# Patient Record
Sex: Female | Born: 1960 | Race: White | Hispanic: No | Marital: Married | Smoking: Never smoker
Health system: Southern US, Community
[De-identification: ages and names within clinical notes are randomized; demographics above are authoritative.]

## PROBLEM LIST (undated history)

## (undated) DIAGNOSIS — M810 Age-related osteoporosis without current pathological fracture: Secondary | ICD-10-CM

## (undated) DIAGNOSIS — Z9889 Other specified postprocedural states: Secondary | ICD-10-CM

## (undated) DIAGNOSIS — R112 Nausea with vomiting, unspecified: Secondary | ICD-10-CM

## (undated) HISTORY — DX: Age-related osteoporosis without current pathological fracture: M81.0

---

## 1997-10-22 ENCOUNTER — Other Ambulatory Visit: Admission: RE | Admit: 1997-10-22 | Discharge: 1997-10-22 | Payer: Self-pay | Admitting: *Deleted

## 2000-01-22 ENCOUNTER — Other Ambulatory Visit: Admission: RE | Admit: 2000-01-22 | Discharge: 2000-01-22 | Payer: Self-pay | Admitting: *Deleted

## 2000-01-22 ENCOUNTER — Encounter (INDEPENDENT_AMBULATORY_CARE_PROVIDER_SITE_OTHER): Payer: Self-pay | Admitting: Specialist

## 2002-03-23 ENCOUNTER — Other Ambulatory Visit: Admission: RE | Admit: 2002-03-23 | Discharge: 2002-03-23 | Payer: Self-pay | Admitting: *Deleted

## 2003-04-20 ENCOUNTER — Other Ambulatory Visit: Admission: RE | Admit: 2003-04-20 | Discharge: 2003-04-20 | Payer: Self-pay | Admitting: Obstetrics and Gynecology

## 2004-05-08 ENCOUNTER — Other Ambulatory Visit: Admission: RE | Admit: 2004-05-08 | Discharge: 2004-05-08 | Payer: Self-pay | Admitting: Obstetrics and Gynecology

## 2008-11-16 ENCOUNTER — Ambulatory Visit: Payer: Self-pay | Admitting: Sports Medicine

## 2008-11-16 DIAGNOSIS — M7661 Achilles tendinitis, right leg: Secondary | ICD-10-CM | POA: Insufficient documentation

## 2008-11-16 DIAGNOSIS — R269 Unspecified abnormalities of gait and mobility: Secondary | ICD-10-CM | POA: Insufficient documentation

## 2008-11-16 DIAGNOSIS — M2170 Unequal limb length (acquired), unspecified site: Secondary | ICD-10-CM | POA: Insufficient documentation

## 2010-04-23 ENCOUNTER — Encounter: Payer: Self-pay | Admitting: Otolaryngology

## 2011-11-12 ENCOUNTER — Encounter: Payer: Self-pay | Admitting: Gastroenterology

## 2011-12-18 ENCOUNTER — Ambulatory Visit (AMBULATORY_SURGERY_CENTER): Payer: Managed Care, Other (non HMO) | Admitting: *Deleted

## 2011-12-18 VITALS — Ht 70.0 in | Wt 128.3 lb

## 2011-12-18 DIAGNOSIS — Z1211 Encounter for screening for malignant neoplasm of colon: Secondary | ICD-10-CM

## 2011-12-18 MED ORDER — MOVIPREP 100 G PO SOLR: 1.0000 | Freq: Once | ORAL | Status: DC

## 2011-12-19 ENCOUNTER — Encounter: Payer: Self-pay | Admitting: Gastroenterology

## 2011-12-25 NOTE — Addendum Note (Signed)
Addended by: Maple Hudson on: 12/25/2011 03:18 PM   Modules accepted: Level of Service

## 2012-01-01 ENCOUNTER — Ambulatory Visit (AMBULATORY_SURGERY_CENTER): Payer: Managed Care, Other (non HMO) | Admitting: Gastroenterology

## 2012-01-01 ENCOUNTER — Encounter: Payer: Self-pay | Admitting: Gastroenterology

## 2012-01-01 VITALS — BP 146/79 | HR 67 | Temp 97.2°F | Resp 17 | Ht 70.0 in | Wt 128.0 lb

## 2012-01-01 DIAGNOSIS — Z1211 Encounter for screening for malignant neoplasm of colon: Secondary | ICD-10-CM

## 2012-01-01 MED ORDER — SODIUM CHLORIDE 0.9 % IV SOLN: 500.0000 mL | INTRAVENOUS | Status: DC

## 2012-01-01 NOTE — Op Note (Signed)
[facility] [address] [place], [address]   COLONOSCOPY PROCEDURE REPORT  PATIENT: Barbara, Weaver  MR#: [id number] BIRTHDATE: 03/21/1961 , 51  yrs. old GENDER: Female ENDOSCOPIST: Rachael Fee, MD REFERRED XB:[id number] Avva, M.D. PROCEDURE DATE:  01/01/2012 PROCEDURE:   Colonoscopy, diagnostic ASA CLASS:   Class II INDICATIONS:average risk screening. MEDICATIONS: Fentanyl 100 mcg IV, Versed 10 mg IV, and These medications were titrated to patient response per physician's verbal order  DESCRIPTION OF PROCEDURE:   After the risks benefits and alternatives of the procedure were thoroughly explained, informed consent was obtained.  A digital rectal exam revealed no abnormalities of the rectum.   The LB [id number]-[id number]  endoscope was introduced through the anus and advanced to the cecum, which was identified by both the appendix and ileocecal valve. No adverse events experienced.   The quality of the prep was good.  The instrument was then slowly withdrawn as the colon was fully examined.    COLON FINDINGS: A normal appearing cecum, ileocecal valve, and appendiceal orifice were identified.  The ascending, hepatic flexure, transverse, splenic flexure, descending, sigmoid colon and rectum appeared unremarkable.  No polyps or cancers were seen. Retroflexed views revealed no abnormalities. The time to cecum=4 minutes 14 seconds.  Withdrawal time=7 minutes 06 seconds.  The scope was withdrawn and the procedure completed. COMPLICATIONS: There were no complications.  ENDOSCOPIC IMPRESSION: Normal colon; No plyps or cancers  RECOMMENDATIONS: You should continue to follow colorectal cancer screening guidelines for "routine risk" patients with a repeat colonoscopy in 10 years. There is no need for FOBT (stool) testing for at least 5 years.   eSigned:  Rachael Fee, MD 01/01/2012 10:17 AM

## 2012-01-01 NOTE — Progress Notes (Signed)
Patient did not have preoperative order for IV antibiotic SSI prophylaxis. (G8918)  Patient did not experience any of the following events: a burn prior to discharge; a fall within the facility; wrong site/side/patient/procedure/implant event; or a hospital transfer or hospital admission upon discharge from the facility. (G8907)  

## 2012-01-01 NOTE — Patient Instructions (Signed)
YOU HAD AN ENDOSCOPIC PROCEDURE TODAY AT THE [facility]: Refer to the procedure report that was given to you for any specific questions about what was found during the examination.  If the procedure report does not answer your questions, please call your gastroenterologist to clarify.  If you requested that your care partner not be given the details of your procedure findings, then the procedure report has been included in a sealed envelope for you to review at your convenience later.  YOU SHOULD EXPECT: Some feelings of bloating in the abdomen. Passage of more gas than usual.  Walking can help get rid of the air that was put into your GI tract during the procedure and reduce the bloating. If you had a lower endoscopy (such as a colonoscopy or flexible sigmoidoscopy) you may notice spotting of blood in your stool or on the toilet paper. If you underwent a bowel prep for your procedure, then you may not have a normal bowel movement for a few days.  DIET: Your first meal following the procedure should be a light meal and then it is ok to progress to your normal diet.  A half-sandwich or bowl of soup is an example of a good first meal.  Heavy or fried foods are harder to digest and may make you feel nauseous or bloated.  Likewise meals heavy in dairy and vegetables can cause extra gas to form and this can also increase the bloating.  Drink plenty of fluids but you should avoid alcoholic beverages for 24 hours.  ACTIVITY: Your care partner should take you home directly after the procedure.  You should plan to take it easy, moving slowly for the rest of the day.  You can resume normal activity the day after the procedure however you should NOT DRIVE or use heavy machinery for 24 hours (because of the sedation medicines used during the test).    SYMPTOMS TO REPORT IMMEDIATELY: A gastroenterologist can be reached at any hour.  During normal business hours, 8:30 AM to 5:00 PM Monday through Friday,  call [phone].  After hours and on weekends, please call the GI answering service at [phone] who will take a message and have the physician on call contact you.   Following lower endoscopy (colonoscopy or flexible sigmoidoscopy):  Excessive amounts of blood in the stool  Significant tenderness or worsening of abdominal pains  Swelling of the abdomen that is new, acute  Fever of 100F or higher    FOLLOW UP: If any biopsies were taken you will be contacted by phone or by letter within the next 1-3 weeks.  Call your gastroenterologist if you have not heard about the biopsies in 3 weeks.  Our staff will call the home number listed on your records the next business day following your procedure to check on you and address any questions or concerns that you may have at that time regarding the information given to you following your procedure. This is a courtesy call and so if there is no answer at the home number and we have not heard from you through the emergency physician on call, we will assume that you have returned to your regular daily activities without incident.  SIGNATURES/CONFIDENTIALITY: You and/or your care partner have signed paperwork which will be entered into your electronic medical record.  These signatures attest to the fact that that the information above on your After Visit Summary has been reviewed and is understood.  Full responsibility of the confidentiality   of this discharge information lies with you and/or your care-partner.     

## 2012-01-02 ENCOUNTER — Telehealth: Payer: Self-pay | Admitting: *Deleted

## 2012-01-02 NOTE — Telephone Encounter (Signed)
No answer, phone number only identifier. Message left to call if questions or concerns.

## 2014-01-05 ENCOUNTER — Ambulatory Visit (INDEPENDENT_AMBULATORY_CARE_PROVIDER_SITE_OTHER): Payer: Managed Care, Other (non HMO) | Admitting: Sports Medicine

## 2014-01-05 ENCOUNTER — Encounter: Payer: Self-pay | Admitting: Sports Medicine

## 2014-01-05 VITALS — BP 107/70 | Ht 70.0 in | Wt 128.0 lb

## 2014-01-05 DIAGNOSIS — M7661 Achilles tendinitis, right leg: Secondary | ICD-10-CM

## 2014-01-05 MED ORDER — NITROGLYCERIN 0.2 MG/HR TD PT24: MEDICATED_PATCH | TRANSDERMAL | Status: DC

## 2014-01-05 NOTE — Progress Notes (Signed)
   Subjective:    Patient ID: Barbara Weaver, female    DOB: [birth year], 53 y.o.   MRN: [id number]  HPI [name] is a 53 year old female who presents with right Achilles pain. Onset of symptoms was approximately 2 months ago without any known acute injury. She is a fairly frequent runner who is training for a half marathon next month. She says that she runs approximately 20-30 miles per week but has recently been somewhat limited due to her right Achilles pain. She does not site any instigating event, and she has not recently changed shoes. It appears as though she is due for new shoes. Aggravating symptoms include running or walking without supportive shoes. She has tried icing, ibuprofen, and calf raises with mild relief. She denies any weakness, numbness, tingling, or swelling. Since her problem started she is noticed gradually increasing right anterior knee pain as well. She feels that her gait has been altered due to her Achilles pain.  Past medical history, social history, medications, and allergies were reviewed and are up to date in the chart.  Review of Systems 7 point review of systems was performed and was otherwise negative unless noted in the history of present illness.    Objective:   Physical Exam BP 107/70  Ht 5\' 10"  (1.778 m)  Wt 128 lb (58.06 kg)  BMI 18.37 kg/m2 GEN: The patient is well-developed well-nourished female and in no acute distress.  She is awake alert and oriented x3. SKIN: warm and well-perfused, no rash  EXTR: No lower extremity edema or calf tenderness Neuro: Strength 5/5 globally. Sensation intact throughout. DTRs 2/4 bilaterally. No focal deficits. Vasc: +2 bilateral distal pulses. No edema.  MSK: Examination of the right foot and calf reveals good strength with plantar flexion. There is an area of tenderness to palpation approximately 2 cm proximal to the right Achilles insertion. There is no overlying erythema or induration. Negative calcaneal squeeze  test. There is no localized swelling. Examination of the right knee reveals no effusion. She has no valgus or varus laxity. Negative Lachman's. Negative McMurray's. Positive patellar grind test. She has good quadriceps strength.  Limited musculoskeletal ultrasound: Long and short axis views were obtained of the bilateral Achilles tendons. Her Achilles tendon on the left measures 0.46 cm 2 cm proximal to the insertion. On the right her Achilles tendon measures 0.68 cm with some overlying fluid and echogenicity at her region approximately 4 cm proximal to the Achilles tendon insertion. There did not appear to be any large tears. Color Doppler demonstrates increased neovascularization in this area of the Achilles tendon.     Assessment & Plan:  Please see problem based assessment and plan in the problem list.

## 2014-01-05 NOTE — Assessment & Plan Note (Signed)
-  Start heel lifts with eccentric exercises 15 reps x twice daily -Continue ice, anti-inflammatories -Apply nitroglycerin, 1/4 patch once daily to the right achilles area of tenderness. Do this daily for the next 6-8 weeks. Sometimes up to 12 weeks is needed. -I believe that your knee pain is related to your altered gait due to achilles tendonitis -Follow-up if needed Nitroglycerin Protocol

## 2014-01-05 NOTE — Patient Instructions (Addendum)
-  Start heel lifts with eccentric exercises 15 reps x twice daily -Continue ice, anti-inflammatories -Apply nitroglycerin, 1/4 patch once daily to the right achilles area of tenderness. Do this daily for the next 6-8 weeks. Sometimes up to 12 weeks is needed. -I believe that your knee pain is related to your altered gait due to achilles tendonitis -Follow-up if needed Nitroglycerin Protocol   Apply 1/4 nitroglycerin patch to affected area daily.  Change position of patch within the affected area every 24 hours.  You may experience a headache during the first 1-2 weeks of using the patch, these should subside.  If you experience headaches after beginning nitroglycerin patch treatment, you may take your preferred over the counter pain reliever.  Another side effect of the nitroglycerin patch is skin irritation or rash related to patch adhesive.  Please notify our office if you develop more severe headaches or rash, and stop the patch.  Tendon healing with nitroglycerin patch may require 12 to 24 weeks depending on the extent of injury.  Men should not use if taking Viagra, Cialis, or Levitra.   Do not use if you have migraines or rosacea.

## 2016-05-28 ENCOUNTER — Other Ambulatory Visit: Payer: Self-pay | Admitting: Orthopedic Surgery

## 2016-07-26 ENCOUNTER — Encounter (HOSPITAL_BASED_OUTPATIENT_CLINIC_OR_DEPARTMENT_OTHER): Payer: Self-pay | Admitting: *Deleted

## 2016-07-31 ENCOUNTER — Ambulatory Visit (HOSPITAL_BASED_OUTPATIENT_CLINIC_OR_DEPARTMENT_OTHER)
Admission: RE | Admit: 2016-07-31 | Discharge: 2016-07-31 | Disposition: A | Discharge status: Home / Self Care | Payer: BLUE CROSS/BLUE SHIELD | Source: Ambulatory Visit | Attending: Orthopedic Surgery | Admitting: Orthopedic Surgery

## 2016-07-31 ENCOUNTER — Encounter (HOSPITAL_BASED_OUTPATIENT_CLINIC_OR_DEPARTMENT_OTHER)
Admission: RE | Disposition: A | Discharge status: Home / Self Care | Payer: Self-pay | Source: Ambulatory Visit | Attending: Orthopedic Surgery

## 2016-07-31 ENCOUNTER — Encounter (HOSPITAL_BASED_OUTPATIENT_CLINIC_OR_DEPARTMENT_OTHER): Payer: Self-pay

## 2016-07-31 ENCOUNTER — Ambulatory Visit (HOSPITAL_BASED_OUTPATIENT_CLINIC_OR_DEPARTMENT_OTHER): Payer: BLUE CROSS/BLUE SHIELD | Admitting: Anesthesiology

## 2016-07-31 DIAGNOSIS — Z882 Allergy status to sulfonamides status: Secondary | ICD-10-CM | POA: Insufficient documentation

## 2016-07-31 DIAGNOSIS — L608 Other nail disorders: Secondary | ICD-10-CM | POA: Diagnosis not present

## 2016-07-31 DIAGNOSIS — Z833 Family history of diabetes mellitus: Secondary | ICD-10-CM | POA: Diagnosis not present

## 2016-07-31 DIAGNOSIS — Z800 Family history of malignant neoplasm of digestive organs: Secondary | ICD-10-CM | POA: Diagnosis not present

## 2016-07-31 DIAGNOSIS — Z9889 Other specified postprocedural states: Secondary | ICD-10-CM | POA: Diagnosis not present

## 2016-07-31 DIAGNOSIS — D1801 Hemangioma of skin and subcutaneous tissue: Secondary | ICD-10-CM | POA: Insufficient documentation

## 2016-07-31 DIAGNOSIS — L988 Other specified disorders of the skin and subcutaneous tissue: Secondary | ICD-10-CM | POA: Diagnosis present

## 2016-07-31 HISTORY — DX: Other specified postprocedural states: Z98.890

## 2016-07-31 HISTORY — DX: Nausea with vomiting, unspecified: R11.2

## 2016-07-31 HISTORY — PX: EXCISION METACARPAL MASS: SHX6372

## 2016-07-31 SURGERY — EXCISION METACARPAL MASS
Anesthesia: General | Site: Thumb | Laterality: Right

## 2016-07-31 MED ORDER — SCOPOLAMINE 1 MG/3DAYS TD PT72: 1.0000 | MEDICATED_PATCH | Freq: Once | TRANSDERMAL | Status: DC | PRN

## 2016-07-31 MED ORDER — DEXAMETHASONE SODIUM PHOSPHATE 10 MG/ML IJ SOLN
INTRAMUSCULAR | Status: AC
  Filled 2016-07-31: qty 1

## 2016-07-31 MED ORDER — LACTATED RINGERS IV SOLN
INTRAVENOUS | Status: DC
  Administered 2016-07-31: 08:00:00 via INTRAVENOUS

## 2016-07-31 MED ORDER — TRAMADOL HCL 50 MG PO TABS: 50.0000 mg | ORAL_TABLET | Freq: Four times a day (QID) | ORAL | 0 refills | Status: DC | PRN

## 2016-07-31 MED ORDER — MIDAZOLAM HCL 2 MG/2ML IJ SOLN
1.0000 mg | INTRAMUSCULAR | Status: DC | PRN
  Administered 2016-07-31: 2 mg via INTRAVENOUS

## 2016-07-31 MED ORDER — CEFAZOLIN SODIUM-DEXTROSE 2-4 GM/100ML-% IV SOLN
INTRAVENOUS | Status: AC
  Filled 2016-07-31: qty 100

## 2016-07-31 MED ORDER — DEXAMETHASONE SODIUM PHOSPHATE 10 MG/ML IJ SOLN
INTRAMUSCULAR | Status: DC | PRN
  Administered 2016-07-31: 10 mg via INTRAVENOUS

## 2016-07-31 MED ORDER — CEFAZOLIN SODIUM-DEXTROSE 2-4 GM/100ML-% IV SOLN
2.0000 g | INTRAVENOUS | Status: AC
  Administered 2016-07-31: 2 g via INTRAVENOUS

## 2016-07-31 MED ORDER — PROPOFOL 500 MG/50ML IV EMUL
INTRAVENOUS | Status: DC | PRN
  Administered 2016-07-31: 75 ug/kg/min via INTRAVENOUS

## 2016-07-31 MED ORDER — BUPIVACAINE HCL (PF) 0.25 % IJ SOLN
INTRAMUSCULAR | Status: DC | PRN
  Administered 2016-07-31: 8 mL

## 2016-07-31 MED ORDER — ONDANSETRON HCL 4 MG/2ML IJ SOLN
INTRAMUSCULAR | Status: DC | PRN
  Administered 2016-07-31: 4 mg via INTRAVENOUS

## 2016-07-31 MED ORDER — MIDAZOLAM HCL 2 MG/2ML IJ SOLN
INTRAMUSCULAR | Status: AC
  Filled 2016-07-31: qty 2

## 2016-07-31 MED ORDER — BUPIVACAINE HCL (PF) 0.25 % IJ SOLN
INTRAMUSCULAR | Status: AC
  Filled 2016-07-31: qty 30

## 2016-07-31 MED ORDER — FENTANYL CITRATE (PF) 100 MCG/2ML IJ SOLN
50.0000 ug | INTRAMUSCULAR | Status: DC | PRN
  Administered 2016-07-31: 100 ug via INTRAVENOUS

## 2016-07-31 MED ORDER — ONDANSETRON HCL 4 MG/2ML IJ SOLN
INTRAMUSCULAR | Status: AC
  Filled 2016-07-31: qty 2

## 2016-07-31 MED ORDER — PROPOFOL 500 MG/50ML IV EMUL
INTRAVENOUS | Status: AC
  Filled 2016-07-31: qty 50

## 2016-07-31 MED ORDER — FENTANYL CITRATE (PF) 100 MCG/2ML IJ SOLN
INTRAMUSCULAR | Status: AC
  Filled 2016-07-31: qty 2

## 2016-07-31 MED ORDER — PROMETHAZINE HCL 25 MG/ML IJ SOLN: 6.2500 mg | INTRAMUSCULAR | Status: DC | PRN

## 2016-07-31 MED ORDER — CHLORHEXIDINE GLUCONATE 4 % EX LIQD: 60.0000 mL | Freq: Once | CUTANEOUS | Status: DC

## 2016-07-31 MED ORDER — HYDROMORPHONE HCL 1 MG/ML IJ SOLN: 0.2500 mg | INTRAMUSCULAR | Status: DC | PRN

## 2016-07-31 MED ORDER — LIDOCAINE 2% (20 MG/ML) 5 ML SYRINGE
INTRAMUSCULAR | Status: DC | PRN
  Administered 2016-07-31: 40 mg via INTRAVENOUS

## 2016-07-31 SURGICAL SUPPLY — 52 items
BANDAGE COBAN STERILE 2 (GAUZE/BANDAGES/DRESSINGS) IMPLANT
BLADE MINI RND TIP GREEN BEAV (BLADE) ×1 IMPLANT
BLADE SURG 15 STRL LF DISP TIS (BLADE) ×1 IMPLANT
BLADE SURG 15 STRL SS (BLADE) ×2
BNDG CMPR 9X4 STRL LF SNTH (GAUZE/BANDAGES/DRESSINGS)
BNDG COHESIVE 1X5 TAN STRL LF (GAUZE/BANDAGES/DRESSINGS) ×1 IMPLANT
BNDG COHESIVE 3X5 TAN STRL LF (GAUZE/BANDAGES/DRESSINGS) IMPLANT
BNDG ESMARK 4X9 LF (GAUZE/BANDAGES/DRESSINGS) IMPLANT
BNDG GAUZE ELAST 4 BULKY (GAUZE/BANDAGES/DRESSINGS) IMPLANT
CHLORAPREP W/TINT 26ML (MISCELLANEOUS) ×2 IMPLANT
CORDS BIPOLAR (ELECTRODE) ×2 IMPLANT
COVER BACK TABLE 60X90IN (DRAPES) ×2 IMPLANT
COVER MAYO STAND STRL (DRAPES) ×2 IMPLANT
CUFF TOURNIQUET SINGLE 18IN (TOURNIQUET CUFF) ×1 IMPLANT
DECANTER SPIKE VIAL GLASS SM (MISCELLANEOUS) IMPLANT
DRAIN PENROSE 1/2X12 LTX STRL (WOUND CARE) IMPLANT
DRAPE EXTREMITY T 121X128X90 (DRAPE) ×2 IMPLANT
DRAPE SURG 17X23 STRL (DRAPES) ×2 IMPLANT
GAUZE SPONGE 4X4 12PLY STRL (GAUZE/BANDAGES/DRESSINGS) ×2 IMPLANT
GAUZE XEROFORM 1X8 LF (GAUZE/BANDAGES/DRESSINGS) ×2 IMPLANT
GLOVE BIO SURGEON STRL SZ 6.5 (GLOVE) ×3 IMPLANT
GLOVE BIOGEL PI IND STRL 7.0 (GLOVE) IMPLANT
GLOVE BIOGEL PI IND STRL 8.5 (GLOVE) ×1 IMPLANT
GLOVE BIOGEL PI INDICATOR 7.0 (GLOVE) ×3
GLOVE BIOGEL PI INDICATOR 8.5 (GLOVE) ×1
GLOVE SURG ORTHO 8.0 STRL STRW (GLOVE) ×2 IMPLANT
GOWN STRL REUS W/ TWL LRG LVL3 (GOWN DISPOSABLE) ×1 IMPLANT
GOWN STRL REUS W/TWL LRG LVL3 (GOWN DISPOSABLE) ×4
GOWN STRL REUS W/TWL XL LVL3 (GOWN DISPOSABLE) ×2 IMPLANT
NDL PRECISIONGLIDE 27X1.5 (NEEDLE) IMPLANT
NDL SAFETY ECLIPSE 18X1.5 (NEEDLE) ×1 IMPLANT
NEEDLE HYPO 18GX1.5 SHARP (NEEDLE) ×2
NEEDLE PRECISIONGLIDE 27X1.5 (NEEDLE) ×2 IMPLANT
NS IRRIG 1000ML POUR BTL (IV SOLUTION) ×2 IMPLANT
PACK BASIN DAY SURGERY FS (CUSTOM PROCEDURE TRAY) ×2 IMPLANT
PAD CAST 3X4 CTTN HI CHSV (CAST SUPPLIES) IMPLANT
PADDING CAST ABS 3INX4YD NS (CAST SUPPLIES)
PADDING CAST ABS 4INX4YD NS (CAST SUPPLIES) ×1
PADDING CAST ABS COTTON 3X4 (CAST SUPPLIES) IMPLANT
PADDING CAST ABS COTTON 4X4 ST (CAST SUPPLIES) ×1 IMPLANT
PADDING CAST COTTON 3X4 STRL (CAST SUPPLIES)
SPLINT FINGER 3.25 BULB 911905 (SOFTGOODS) ×1 IMPLANT
SPLINT PLASTER CAST XFAST 3X15 (CAST SUPPLIES) IMPLANT
SPLINT PLASTER XTRA FASTSET 3X (CAST SUPPLIES)
STOCKINETTE 4X48 STRL (DRAPES) ×2 IMPLANT
SUT CHROMIC 6 0 G 1 (SUTURE) ×1 IMPLANT
SUT ETHILON 4 0 PS 2 18 (SUTURE) ×2 IMPLANT
SUT VIC AB 4-0 P2 18 (SUTURE) IMPLANT
SYR BULB 3OZ (MISCELLANEOUS) ×2 IMPLANT
SYR CONTROL 10ML LL (SYRINGE) ×1 IMPLANT
TOWEL OR 17X24 6PK STRL BLUE (TOWEL DISPOSABLE) ×4 IMPLANT
UNDERPAD 30X30 (UNDERPADS AND DIAPERS) ×2 IMPLANT

## 2016-07-31 NOTE — Op Note (Signed)
Dictation Number [id number]

## 2016-07-31 NOTE — Anesthesia Preprocedure Evaluation (Addendum)
Anesthesia Evaluation  Patient identified by MRN, date of birth, ID band Patient awake    Reviewed: Allergy & Precautions, NPO status , Patient's Chart, lab work & pertinent test results  History of Anesthesia Complications (+) history of anesthetic complications  Airway Mallampati: II   Neck ROM: Full    Dental no notable dental hx.    Pulmonary neg pulmonary ROS,    breath sounds clear to auscultation       Cardiovascular negative cardio ROS   Rhythm:Regular Rate:Normal     Neuro/Psych negative neurological ROS  negative psych ROS   GI/Hepatic negative GI ROS, Neg liver ROS,   Endo/Other  negative endocrine ROS  Renal/GU negative Renal ROS  negative genitourinary   Musculoskeletal negative musculoskeletal ROS (+)   Abdominal   Peds negative pediatric ROS (+)  Hematology negative hematology ROS (+)   Anesthesia Other Findings   Reproductive/Obstetrics negative OB ROS                            Anesthesia Physical Anesthesia Plan  ASA: I  Anesthesia Plan: Regional   Post-op Pain Management:    Induction:   Airway Management Planned: Natural Airway  Additional Equipment:   Intra-op Plan:   Post-operative Plan:   Informed Consent: I have reviewed the patients History and Physical, chart, labs and discussed the procedure including the risks, benefits and alternatives for the proposed anesthesia with the patient or authorized representative who has indicated his/her understanding and acceptance.   Dental advisory given  Plan Discussed with:   Anesthesia Plan Comments:        Anesthesia Quick Evaluation

## 2016-07-31 NOTE — H&P (Signed)
  Barbara Weaver is an 56 y.o. female.   Chief Complaint: mass right thumb and melanotic striata nailbed HPI: Barbara Weaver is a 56 year old right-hand-dominant female referred by Dr. Renda Rolls. She is referred with a melanotic streak on her right thumb nail matrix. She also has a mass of the metacarpal phalangeal joint. This she states this is been present for approximately a year and a half. She recalls no history of injury. She states it is not enlarged. It causes mild discomfort of the nail is present time. This gives her a aching type pain approximately 4/10. Is dull in nature. She is not complaining of any discomfort over the mass on the dorsal aspect of the metacarpal phalangeal joint. She has no history diabetes thyroid problems arthritis gout. Family history is positive diabetes negative for thyroid problems arthritis and gout. She has been tested for diabetes. She states it is not enlarged or significantly changed in position.      Past Medical History:  Diagnosis Date  . PONV (postoperative nausea and vomiting)     Past Surgical History:  Procedure Laterality Date  . CESAREAN SECTION  1992    Family History  Problem Relation Age of Onset  . Esophageal cancer Mother   . Colon cancer Neg Hx   . Rectal cancer Neg Hx   . Stomach cancer Neg Hx    Social History:  reports that she has never smoked. She has never used smokeless tobacco. She reports that she drinks about 2.5 oz of alcohol per week . She reports that she does not use drugs.  Allergies:  Allergies  Allergen Reactions  . Sulfa Antibiotics Rash    No prescriptions prior to admission.    No results found for this or any previous visit (from the past 48 hour(s)).  No results found.   Pertinent items are noted in HPI.  Height 5\' 10"  (1.778 m), weight 58.1 kg (128 lb), last menstrual period 12/17/2011.  General appearance: alert, cooperative and appears stated age Head: Normocephalic, without obvious  abnormality Neck: no JVD Resp: clear to auscultation bilaterally Cardio: regular rate and rhythm, S1, S2 normal, no murmur, click, rub or gallop GI: soft, non-tender; bowel sounds normal; no masses,  no organomegaly Extremities: mass right thumb pigmented lesion nailbed Pulses: 2+ and symmetric Skin: Skin color, texture, turgor normal. No rashes or lesions Neurologic: Grossly normal Incision/Wound: na  Assessment/Plan Assessment:   Mass   Melanonychia striata    Plan: She is aware of the potential that this could be a melanoma. Dr. Armanda Magic notes are reviewed. Discussed possibility of biopsy of the nail plate matrix with her. This will be done along with excision of the mass at the metacarpal phalangeal joint. Aware that there is no guarantee to the surgery the possibility of infection recurrence injury to arteries nerves tendons incomplete release symptoms dystrophy. Possibility of further intervention being necessary. She would like to proceed. She is scheduled for nail bed biopsy right thumb with excision of mass metacarpal phalangeal joint right thumb as an outpatient under regional anesthesia.      Amori Cooperman R 07/31/2016, 6:22 AM

## 2016-07-31 NOTE — Brief Op Note (Signed)
07/31/2016  9:06 AM  PATIENT:  Shanda Bumps  56 y.o. female  PRE-OPERATIVE DIAGNOSIS:  MELANOTIC STREAK RIGHT THUMB, MASS MCP  POST-OPERATIVE DIAGNOSIS:  MELANOTIC STREAK RIGHT THUMB, MASS MCP  PROCEDURE:  Procedure(s): EXCISION MASS RIGHT THUMB (Right) BIOPSY RIGHT NAIL MATRIX (Right)  SURGEON:  Surgeon(s) and Role:    * Daryll Brod, MD - Primary  PHYSICIAN ASSISTANT:   ASSISTANTS: none   ANESTHESIA:   local and regional  EBL:  Total I/O In: 400 [I.V.:400] Out: 2 [Blood:2]  BLOOD ADMINISTERED:none  DRAINS: none   LOCAL MEDICATIONS USED:  BUPIVICAINE   SPECIMEN:  Excision  DISPOSITION OF SPECIMEN:  PATHOLOGY  COUNTS:  YES  TOURNIQUET:   Total Tourniquet Time Documented: Upper Arm (Right) - 25 minutes Total: Upper Arm (Right) - 25 minutes   DICTATION: .Other Dictation: Dictation Number [id number]  PLAN OF CARE: Discharge to home after PACU  PATIENT DISPOSITION:  PACU - hemodynamically stable.

## 2016-07-31 NOTE — Op Note (Signed)
NAME:  Barbara Weaver               ACCOUNT NO.:  [id number]  MEDICAL RECORD NO.:  [id number]  LOCATION:                                 FACILITY:  PHYSICIAN:  Daryll Brod, M.D.            DATE OF BIRTH:  DATE OF PROCEDURE:  07/31/2016 DATE OF DISCHARGE:                              OPERATIVE REPORT   PREOPERATIVE DIAGNOSIS:  Mass, right thumb; melanotic striata, right thumb nail bed.  POSTOPERATIVE DIAGNOSIS:  Mass, right thumb; melanotic striata, right thumb nail bed.  OPERATION:  Excision of mass, right thumb metacarpophalangeal joint. Biopsy nail bed, right thumb.  SURGEON:  Daryll Brod, M.D.  ASSISTANT:  None.  ANESTHESIA:  Forearm-based IV regional with metacarpal block.  PLACE OF SURGERY:  [facility].  HISTORY:  The patient is a 56 year old female with a melanotic streak going down her right thumb nail.  She also has a mass over the metacarpophalangeal joint of the right thumb.  She is desirous of having the mass removed along with biopsy of the nail matrix.  Pre, peri, and postoperative course have been discussed along with risks and complications.  She is aware that there is no guarantee to the surgery; the possibility of infection; recurrence of injury to arteries, nerves, tendons; incomplete relief of symptoms; and dystrophy.  In preoperative area, the patient was seen, the extremity was marked by both patient and surgeon.  Antibiotic given.  PROCEDURE IN DETAIL:  The patient was brought to the operating room, where a forearm-based IV regional anesthetic was carried out without difficulty under the direction of the Anesthesia Department.  She was prepped using ChloraPrep in a supine position with the right arm free. A 3-minute dry time was allowed, and a time-out taken, confirming the patient and procedure.  The mass over the metacarpophalangeal joint was approached first.  A metacarpal block was given 0.25% bupivacaine without epinephrine.  The 8 mL  was used.  The incision was made over the mass, carried down through subcutaneous tissue.  A multilobulated dark mass was immediately encountered directly adjacent to a dorsal vein. With blunt and sharp dissection, it was dissected free.  The vein cauterized and that it appeared that this may be a vascular malformation coming from the vein.  The specimen was sent to Pathology.  This measured approximately 6/10s of a centimeter in diameter.  The entire specimen was sent.  The wound was irrigated and the skin closed with interrupted 4-0 nylon sutures.  The nail plate was then removed with a periosteal elevator.  A large ridge was present directly under the nail plate where the melanotic strip was noted.  This had been marked prior to removal of the nail on either end.  An elliptical incision was then made over the entire area of the change.  The incision was carried out with separate instruments and carried down to the bone.  The specimen was sent to Pathology.  The nail matrix was then undermined on either side, irrigated and closed with interrupted 6-0 chromic sutures.  A nonadherent gauze was placed into the nail fold.  Nonadherent gauze was placed over this and over  the mass proximally.  A sterile compressive dressing and splint to the finger applied.  On deflation of the tourniquet, remaining fingers pinked.  She was taken to the recovery room for observation in satisfactory condition.  She will be discharged to home to return to the [place] in 1 week, on [facility].          ______________________________ Daryll Brod, M.D.     GK/MEDQ  D:  07/31/2016  T:  07/31/2016  Job:  [id number]

## 2016-07-31 NOTE — Anesthesia Procedure Notes (Signed)
Anesthesia Regional Block: Bier block (IV Regional)   Pre-Anesthetic Checklist: ,, timeout performed, Correct Patient, Correct Site, Correct Laterality, Correct Procedure,, site marked, surgical consent,, at surgeon's request Needles:  Injection technique: Single-shot  Needle Type: Other      Needle Gauge: 22     Additional Needles:   Procedures:,,,,,,, Esmarch exsanguination, single tourniquet utilized,  Narrative:  Start time: 07/31/2016 8:34 AM End time: 07/31/2016 8:35 AM Injection made incrementally with aspirations every 35 mL.  Performed by: Personally   Additional Notes: Esmark wrap, torq inflated to 250, neg pulse, inject slowly 0.5% pres free Lidocaine. Pt tol well, VSS.

## 2016-07-31 NOTE — Discharge Instructions (Addendum)

## 2016-07-31 NOTE — Anesthesia Postprocedure Evaluation (Addendum)
Anesthesia Post Note  Patient: ANGELAMARIE AVAKIAN  Procedure(s) Performed: Procedure(s) (LRB): EXCISION MASS RIGHT THUMB (Right) BIOPSY RIGHT NAIL MATRIX (Right)  Patient location during evaluation: PACU Anesthesia Type: MAC Level of consciousness: awake and alert Pain management: pain level controlled Vital Signs Assessment: post-procedure vital signs reviewed and stable Respiratory status: spontaneous breathing, nonlabored ventilation, respiratory function stable and patient connected to nasal cannula oxygen Cardiovascular status: stable and blood pressure returned to baseline Anesthetic complications: no       Last Vitals:  Vitals:   07/31/16 0915 07/31/16 0935  BP: 110/84 132/83  Pulse: (!) 59 (!) 56  Resp: 17 16  Temp:  36.6 C    Last Pain:  Vitals:   07/31/16 0935  TempSrc: Oral  PainSc:                  Manha Amato,JAMES TERRILL

## 2016-07-31 NOTE — Transfer of Care (Signed)
Immediate Anesthesia Transfer of Care Note  Patient: Barbara Weaver  Procedure(s) Performed: Procedure(s): EXCISION MASS RIGHT THUMB (Right) BIOPSY RIGHT NAIL MATRIX (Right)  Patient Location: PACU  Anesthesia Type:MAC  Level of Consciousness: awake and alert   Airway & Oxygen Therapy: Patient Spontanous Breathing and Patient connected to face mask oxygen  Post-op Assessment: Report given to RN and Post -op Vital signs reviewed and stable  Post vital signs: Reviewed and stable  Last Vitals:  Vitals:   07/31/16 0742  BP: 118/66  Pulse: (!) 57  Resp: 18  Temp: 36.4 C    Last Pain:  Vitals:   07/31/16 0742  TempSrc: Oral         Complications: No apparent anesthesia complications

## 2016-08-01 ENCOUNTER — Encounter (HOSPITAL_BASED_OUTPATIENT_CLINIC_OR_DEPARTMENT_OTHER): Payer: Self-pay | Admitting: Orthopedic Surgery

## 2016-08-31 NOTE — Addendum Note (Signed)
Addendum  created 08/31/16 1429 by Rica Koyanagi, MD   Sign clinical note

## 2018-01-21 DIAGNOSIS — Z1231 Encounter for screening mammogram for malignant neoplasm of breast: Secondary | ICD-10-CM | POA: Diagnosis not present

## 2018-03-07 DIAGNOSIS — Z0000 Encounter for general adult medical examination without abnormal findings: Secondary | ICD-10-CM | POA: Diagnosis not present

## 2018-03-07 DIAGNOSIS — M859 Disorder of bone density and structure, unspecified: Secondary | ICD-10-CM | POA: Diagnosis not present

## 2018-03-14 DIAGNOSIS — Z1389 Encounter for screening for other disorder: Secondary | ICD-10-CM | POA: Diagnosis not present

## 2018-03-14 DIAGNOSIS — Z6827 Body mass index (BMI) 27.0-27.9, adult: Secondary | ICD-10-CM | POA: Diagnosis not present

## 2018-03-14 DIAGNOSIS — M859 Disorder of bone density and structure, unspecified: Secondary | ICD-10-CM | POA: Diagnosis not present

## 2018-03-14 DIAGNOSIS — J3089 Other allergic rhinitis: Secondary | ICD-10-CM | POA: Diagnosis not present

## 2018-03-14 DIAGNOSIS — Z23 Encounter for immunization: Secondary | ICD-10-CM | POA: Diagnosis not present

## 2018-03-14 DIAGNOSIS — Z0000 Encounter for general adult medical examination without abnormal findings: Secondary | ICD-10-CM | POA: Diagnosis not present

## 2018-05-15 DIAGNOSIS — H5213 Myopia, bilateral: Secondary | ICD-10-CM | POA: Diagnosis not present

## 2018-08-06 DIAGNOSIS — H43811 Vitreous degeneration, right eye: Secondary | ICD-10-CM | POA: Diagnosis not present

## 2018-08-06 DIAGNOSIS — H5213 Myopia, bilateral: Secondary | ICD-10-CM | POA: Diagnosis not present

## 2019-01-03 DIAGNOSIS — Z23 Encounter for immunization: Secondary | ICD-10-CM | POA: Diagnosis not present

## 2019-01-26 DIAGNOSIS — Z681 Body mass index (BMI) 19 or less, adult: Secondary | ICD-10-CM | POA: Diagnosis not present

## 2019-01-26 DIAGNOSIS — Z01419 Encounter for gynecological examination (general) (routine) without abnormal findings: Secondary | ICD-10-CM | POA: Diagnosis not present

## 2019-01-26 DIAGNOSIS — Z1231 Encounter for screening mammogram for malignant neoplasm of breast: Secondary | ICD-10-CM | POA: Diagnosis not present

## 2019-03-31 DIAGNOSIS — M859 Disorder of bone density and structure, unspecified: Secondary | ICD-10-CM | POA: Diagnosis not present

## 2019-03-31 DIAGNOSIS — Z0000 Encounter for general adult medical examination without abnormal findings: Secondary | ICD-10-CM | POA: Diagnosis not present

## 2019-03-31 DIAGNOSIS — R7889 Finding of other specified substances, not normally found in blood: Secondary | ICD-10-CM | POA: Diagnosis not present

## 2019-04-07 DIAGNOSIS — Z1331 Encounter for screening for depression: Secondary | ICD-10-CM | POA: Diagnosis not present

## 2019-04-07 DIAGNOSIS — M8580 Other specified disorders of bone density and structure, unspecified site: Secondary | ICD-10-CM | POA: Diagnosis not present

## 2019-04-07 DIAGNOSIS — Z0000 Encounter for general adult medical examination without abnormal findings: Secondary | ICD-10-CM | POA: Diagnosis not present

## 2019-05-18 DIAGNOSIS — H5213 Myopia, bilateral: Secondary | ICD-10-CM | POA: Diagnosis not present

## 2019-05-30 ENCOUNTER — Ambulatory Visit: Payer: Self-pay | Attending: Internal Medicine

## 2019-05-30 DIAGNOSIS — Z23 Encounter for immunization: Secondary | ICD-10-CM | POA: Insufficient documentation

## 2019-05-30 NOTE — Progress Notes (Signed)
   Covid-19 Vaccination Clinic  Name:  Barbara Weaver    MRN: [id number] DOB: 1960-08-20  05/30/2019  [name] was observed post Covid-19 immunization for 15 minutes without incidence. She was provided with Vaccine Information Sheet and instruction to access the V-Safe system.   [name] was instructed to call 911 with any severe reactions post vaccine: Marland Kitchen Difficulty breathing  . Swelling of your face and throat  . A fast heartbeat  . A bad rash all over your body  . Dizziness and weakness    Immunizations Administered    Name Date Dose VIS Date Route   Pfizer COVID-19 Vaccine 05/30/2019  3:14 PM 0.3 mL 03/13/2019 Intramuscular   Manufacturer: [place]   Lot: [id number]   [place]: [id number]

## 2019-06-18 DIAGNOSIS — L821 Other seborrheic keratosis: Secondary | ICD-10-CM | POA: Diagnosis not present

## 2019-06-18 DIAGNOSIS — D485 Neoplasm of uncertain behavior of skin: Secondary | ICD-10-CM | POA: Diagnosis not present

## 2019-06-18 DIAGNOSIS — C44319 Basal cell carcinoma of skin of other parts of face: Secondary | ICD-10-CM | POA: Diagnosis not present

## 2019-06-18 DIAGNOSIS — Z23 Encounter for immunization: Secondary | ICD-10-CM | POA: Diagnosis not present

## 2019-06-18 DIAGNOSIS — D225 Melanocytic nevi of trunk: Secondary | ICD-10-CM | POA: Diagnosis not present

## 2019-06-18 DIAGNOSIS — L814 Other melanin hyperpigmentation: Secondary | ICD-10-CM | POA: Diagnosis not present

## 2019-06-24 ENCOUNTER — Ambulatory Visit: Payer: Self-pay | Attending: Internal Medicine

## 2019-06-24 DIAGNOSIS — Z23 Encounter for immunization: Secondary | ICD-10-CM

## 2019-06-24 NOTE — Progress Notes (Signed)
   Covid-19 Vaccination Clinic  Name:  Barbara Weaver    MRN: [id number] DOB: Oct 25, 1960  06/24/2019  [name] was observed post Covid-19 immunization for 15 minutes without incident. She was provided with Vaccine Information Sheet and instruction to access the V-Safe system.   [name] was instructed to call 911 with any severe reactions post vaccine: Marland Kitchen Difficulty breathing  . Swelling of face and throat  . A fast heartbeat  . A bad rash all over body  . Dizziness and weakness   Immunizations Administered    Name Date Dose VIS Date Route   Pfizer COVID-19 Vaccine 06/24/2019  2:30 PM 0.3 mL 03/13/2019 Intramuscular   Manufacturer: [place]   Lot: [id number]   [place]: [id number]

## 2019-07-28 DIAGNOSIS — C44319 Basal cell carcinoma of skin of other parts of face: Secondary | ICD-10-CM | POA: Diagnosis not present

## 2019-11-09 ENCOUNTER — Other Ambulatory Visit (HOSPITAL_COMMUNITY): Payer: Self-pay | Admitting: Internal Medicine

## 2019-11-09 MED FILL — SHINGRIX 50 MCG SUS: 50 | 1 days supply | Qty: 1 | Fill #0

## 2019-11-27 DIAGNOSIS — R6884 Jaw pain: Secondary | ICD-10-CM | POA: Diagnosis not present

## 2019-12-09 DIAGNOSIS — R6884 Jaw pain: Secondary | ICD-10-CM | POA: Diagnosis not present

## 2019-12-16 DIAGNOSIS — R6884 Jaw pain: Secondary | ICD-10-CM | POA: Diagnosis not present

## 2019-12-23 DIAGNOSIS — R6884 Jaw pain: Secondary | ICD-10-CM | POA: Diagnosis not present

## 2019-12-29 DIAGNOSIS — R6884 Jaw pain: Secondary | ICD-10-CM | POA: Diagnosis not present

## 2019-12-30 DIAGNOSIS — R229 Localized swelling, mass and lump, unspecified: Secondary | ICD-10-CM | POA: Diagnosis not present

## 2020-01-06 DIAGNOSIS — R6884 Jaw pain: Secondary | ICD-10-CM | POA: Diagnosis not present

## 2020-01-09 MED FILL — SHINGRIX 50 MCG SUS: 50 | 1 days supply | Qty: 1 | Fill #1

## 2020-01-15 DIAGNOSIS — R6884 Jaw pain: Secondary | ICD-10-CM | POA: Diagnosis not present

## 2020-01-22 DIAGNOSIS — R6884 Jaw pain: Secondary | ICD-10-CM | POA: Diagnosis not present

## 2020-02-05 DIAGNOSIS — R6884 Jaw pain: Secondary | ICD-10-CM | POA: Diagnosis not present

## 2020-02-09 DIAGNOSIS — Z681 Body mass index (BMI) 19 or less, adult: Secondary | ICD-10-CM | POA: Diagnosis not present

## 2020-02-09 DIAGNOSIS — Z13820 Encounter for screening for osteoporosis: Secondary | ICD-10-CM | POA: Diagnosis not present

## 2020-02-09 DIAGNOSIS — Z01419 Encounter for gynecological examination (general) (routine) without abnormal findings: Secondary | ICD-10-CM | POA: Diagnosis not present

## 2020-02-09 DIAGNOSIS — Z1231 Encounter for screening mammogram for malignant neoplasm of breast: Secondary | ICD-10-CM | POA: Diagnosis not present

## 2020-03-09 MED FILL — SHINGRIX 50 MCG SUS: 50 | 1 days supply | Qty: 1 | Fill #1

## 2020-04-20 DIAGNOSIS — R7989 Other specified abnormal findings of blood chemistry: Secondary | ICD-10-CM | POA: Diagnosis not present

## 2020-04-20 DIAGNOSIS — M859 Disorder of bone density and structure, unspecified: Secondary | ICD-10-CM | POA: Diagnosis not present

## 2020-04-20 DIAGNOSIS — Z0000 Encounter for general adult medical examination without abnormal findings: Secondary | ICD-10-CM | POA: Diagnosis not present

## 2020-04-27 DIAGNOSIS — Z23 Encounter for immunization: Secondary | ICD-10-CM | POA: Diagnosis not present

## 2020-04-27 DIAGNOSIS — R82998 Other abnormal findings in urine: Secondary | ICD-10-CM | POA: Diagnosis not present

## 2020-04-27 DIAGNOSIS — M8580 Other specified disorders of bone density and structure, unspecified site: Secondary | ICD-10-CM | POA: Diagnosis not present

## 2020-04-27 DIAGNOSIS — Z0000 Encounter for general adult medical examination without abnormal findings: Secondary | ICD-10-CM | POA: Diagnosis not present

## 2020-06-03 DIAGNOSIS — H524 Presbyopia: Secondary | ICD-10-CM | POA: Diagnosis not present

## 2020-06-03 DIAGNOSIS — H2513 Age-related nuclear cataract, bilateral: Secondary | ICD-10-CM | POA: Diagnosis not present

## 2020-06-03 DIAGNOSIS — H5213 Myopia, bilateral: Secondary | ICD-10-CM | POA: Diagnosis not present

## 2020-06-27 ENCOUNTER — Other Ambulatory Visit: Payer: Self-pay

## 2020-06-27 ENCOUNTER — Ambulatory Visit: Payer: 59 | Admitting: Family Medicine

## 2020-06-27 VITALS — BP 120/68 | Ht 70.0 in | Wt 135.0 lb

## 2020-06-27 DIAGNOSIS — M79604 Pain in right leg: Secondary | ICD-10-CM | POA: Diagnosis not present

## 2020-06-27 NOTE — Patient Instructions (Signed)
Your exam is reassuring. This is consistent with an irritated nerve in this distribution though it's possible you have relative gluteus medius weakness with sciatica that only gets really aggravated with hills. Either way the rehab will be similar. Do home exercises and stretches daily. I would drop down to about half of your weekly mileage and not run on back to back days while you rehab this. Keep me updated if anything changes. Consider formal physical therapy. As you improve I'd recommend analyzing your running gait also. Follow up with me in 5-6 weeks.

## 2020-06-28 ENCOUNTER — Encounter: Payer: Self-pay | Admitting: Family Medicine

## 2020-06-28 NOTE — Progress Notes (Signed)
PCP: Prince Solian, MD  Subjective:   HPI: Patient is a 60 y.o. female here for right knee pain.  Patient is an avid runner. She states back in the fall she felt a pull in right gluteal region. She was training for a half marathon and continued to run/exercise since that time with continued achiness. This has progressed to include pain down leg to knee. Recently pain was lateral right knee, currently more medial in right knee. Had been sharp. No catching, locking, giving out of knee. Has not run in 1 week. No apparent swelling. No numbness. Has been stretching and using roller.  Past Medical History:  Diagnosis Date  . PONV (postoperative nausea and vomiting)     Current Outpatient Medications on File Prior to Visit  Medication Sig Dispense Refill  . b complex vitamins tablet Take 1 tablet by mouth daily.    . Multiple Vitamins-Minerals (MULTIVITAMIN PO) Take 1 tablet by mouth daily.    . traMADol (ULTRAM) 50 MG tablet Take 1 tablet (50 mg total) by mouth every 6 (six) hours as needed. 20 tablet 0  . VITAMIN D, ERGOCALCIFEROL, PO Take 1 tablet by mouth daily.     No current facility-administered medications on file prior to visit.    Past Surgical History:  Procedure Laterality Date  . CESAREAN SECTION  1992  . EXCISION METACARPAL MASS Right 07/31/2016   Procedure: EXCISION MASS RIGHT THUMB;  Surgeon: Daryll Brod, MD;  Location: [place];  Service: Orthopedics;  Laterality: Right;    Allergies  Allergen Reactions  . Sulfa Antibiotics Rash    Social History   Socioeconomic History  . Marital status: Married    Spouse name: Not on file  . Number of children: Not on file  . Years of education: Not on file  . Highest education level: Not on file  Occupational History  . Not on file  Tobacco Use  . Smoking status: Never Smoker  . Smokeless tobacco: Never Used  Vaping Use  . Vaping Use: Never used  Substance and Sexual Activity  . Alcohol use:  Yes    Alcohol/week: 5.0 standard drinks    Types: 5 drink(s) per week  . Drug use: No  . Sexual activity: Not on file  Other Topics Concern  . Not on file  Social History Narrative  . Not on file   Social Determinants of Health   Financial Resource Strain: Not on file  Food Insecurity: Not on file  Transportation Needs: Not on file  Physical Activity: Not on file  Stress: Not on file  Social Connections: Not on file  Intimate Partner Violence: Not on file    Family History  Problem Relation Age of Onset  . Esophageal cancer Mother   . Colon cancer Neg Hx   . Rectal cancer Neg Hx   . Stomach cancer Neg Hx     BP 120/68   Ht 5\' 10"  (1.778 m)   Wt 135 lb (61.2 kg)   LMP 12/17/2011   BMI 19.37 kg/m   [place] Adult Exercise 06/27/2020  Frequency of aerobic exercise (# of days/week) 3  Average time in minutes 60  Frequency of strengthening activities (# of days/week) 1    No flowsheet data found.  Review of Systems: See HPI above.     Objective:  Physical Exam:  Gen: NAD, comfortable in exam room  Right hip: No deformity. FROM with 5/5 strength without reproduction of pain. No tenderness to  palpation. NVI distally. Negative logroll Negative faber, fadir, and piriformis stretches.  Right knee: No gross deformity, ecchymoses, swelling. No TTP of joint lines, ITB, pes insertion. FROM with normal strength. Negative ant/post drawers. Negative valgus/varus testing. Negative lachman, lever. Negative mcmurrays, apleys. NV intact distally.   Assessment & Plan:  1. Right leg pain - patient describes glut tendon vs proximal hamstring strain though currently without tenderness, weakness, pain on provocative testing.  Pain into knee with also reassuring testing.  Suspect she either has some neuropathic issue causing relative weakness, pain along distribution (either at level of glutes or lumbar spine) or relative glut medius weakness with sciatica  (though distribution here wouldn't fit her knee pain).  Start home exercises and stretches with focus on gluteal muscles, quad, hamstrings.  Start at 50% of her running volume and only run every other day.  Call us if anything changes.  Consider formal physical therapy.  Would like to evaluate running gait as she improves.

## 2020-08-01 ENCOUNTER — Ambulatory Visit: Payer: 59 | Admitting: Family Medicine

## 2020-08-08 ENCOUNTER — Ambulatory Visit: Payer: 59 | Admitting: Family Medicine

## 2020-08-08 ENCOUNTER — Encounter: Payer: Self-pay | Admitting: Family Medicine

## 2020-08-08 ENCOUNTER — Other Ambulatory Visit: Payer: Self-pay

## 2020-08-08 VITALS — BP 110/72 | Ht 70.0 in | Wt 135.0 lb

## 2020-08-08 DIAGNOSIS — M79604 Pain in right leg: Secondary | ICD-10-CM | POA: Diagnosis not present

## 2020-08-08 NOTE — Progress Notes (Signed)
PCP: Prince Solian, MD  Subjective:   HPI: Patient is a 60 y.o. female here for follow-up of right knee pain.  Continues to have right medial knee pain with walking and running but states she is improving slowly. It appears to be worse when walking.  And will sometimes stiffen when sitting but loosens up with movement.  Patient has been doing home exercises and continues to run at about 50% of her max.  Occasionally uses ibuprofen for relief.  Denies catching or clicking of knee.  Past Medical History:  Diagnosis Date  . PONV (postoperative nausea and vomiting)     Current Outpatient Medications on File Prior to Visit  Medication Sig Dispense Refill  . b complex vitamins tablet Take 1 tablet by mouth daily.    . Multiple Vitamins-Minerals (MULTIVITAMIN PO) Take 1 tablet by mouth daily.    . traMADol (ULTRAM) 50 MG tablet Take 1 tablet (50 mg total) by mouth every 6 (six) hours as needed. 20 tablet 0  . VITAMIN D, ERGOCALCIFEROL, PO Take 1 tablet by mouth daily.    Marland Kitchen Zoster Vaccine Adjuvanted [facility]) injection TO BE ADMINISTERED BY THE PHARMACIST 1 each 1   No current facility-administered medications on file prior to visit.    Past Surgical History:  Procedure Laterality Date  . CESAREAN SECTION  1992  . EXCISION METACARPAL MASS Right 07/31/2016   Procedure: EXCISION MASS RIGHT THUMB;  Surgeon: Daryll Brod, MD;  Location: [place];  Service: Orthopedics;  Laterality: Right;    Allergies  Allergen Reactions  . Sulfa Antibiotics Rash    Social History   Socioeconomic History  . Marital status: Married    Spouse name: Not on file  . Number of children: Not on file  . Years of education: Not on file  . Highest education level: Not on file  Occupational History  . Not on file  Tobacco Use  . Smoking status: Never Smoker  . Smokeless tobacco: Never Used  Vaping Use  . Vaping Use: Never used  Substance and Sexual Activity  . Alcohol use: Yes     Alcohol/week: 5.0 standard drinks    Types: 5 drink(s) per week  . Drug use: No  . Sexual activity: Not on file  Other Topics Concern  . Not on file  Social History Narrative  . Not on file   Social Determinants of Health   Financial Resource Strain: Not on file  Food Insecurity: Not on file  Transportation Needs: Not on file  Physical Activity: Not on file  Stress: Not on file  Social Connections: Not on file  Intimate Partner Violence: Not on file    Family History  Problem Relation Age of Onset  . Esophageal cancer Mother   . Colon cancer Neg Hx   . Rectal cancer Neg Hx   . Stomach cancer Neg Hx     BP 110/72   Ht 5\' 10"  (1.778 m)   Wt 135 lb (61.2 kg)   LMP 12/17/2011   BMI 19.37 kg/m   [place] Adult Exercise 06/27/2020 08/08/2020  Frequency of aerobic exercise (# of days/week) 3 3  Average time in minutes 60 60  Frequency of strengthening activities (# of days/week) 1 1    No flowsheet data found.  Review of Systems: See HPI above.     Objective:  Physical Exam:  Gen: NAD, comfortable in exam room  Right knee No gross deformity, ecchymoses, swelling. Mildly TTP of medial joint  space. No tenderness at sartorius insertion.  FROM with normal strength. Negative ant/post drawers. Negative valgus/varus testing. Negative lachman. Negative mcmurrays, apleys. NV intact distally.  Right hip No deformity. FROM with 5/5 strength. No tenderness to palpation of gluteal tendon. Mild TTP of piriformis. NVI distally. Negative logroll Negative faber, fadir, and piriformis stretches.  Forefoot striker. Mild genu valgus with running.  Outturning R>L foot. Otherwise normal gait  Assessment & Plan:  1. Right knee pain Improving since last visit. Continues to have some medial knee pain with walking/running. Low suspicion for meniscal injury.  More proximal issues (glut tendon, hamstring strain) improving. Will continue with home exercises and  increasing running as tolerated. Likely to continue to improve with conservative measure. Formal PT at this time would prove to be low utilization. Follow up as needed for worsening or return of pain.

## 2020-11-10 DIAGNOSIS — Z20822 Contact with and (suspected) exposure to covid-19: Secondary | ICD-10-CM | POA: Diagnosis not present

## 2020-11-14 ENCOUNTER — Other Ambulatory Visit (HOSPITAL_COMMUNITY): Payer: Self-pay

## 2020-11-14 MED ORDER — AZITHROMYCIN 500 MG PO TABS
ORAL_TABLET | ORAL | 0 refills | Status: DC
  Filled 2020-11-14: qty 3, 3d supply, fill #0

## 2021-01-16 DIAGNOSIS — L821 Other seborrheic keratosis: Secondary | ICD-10-CM | POA: Diagnosis not present

## 2021-01-16 DIAGNOSIS — L570 Actinic keratosis: Secondary | ICD-10-CM | POA: Diagnosis not present

## 2021-01-16 DIAGNOSIS — Z23 Encounter for immunization: Secondary | ICD-10-CM | POA: Diagnosis not present

## 2021-02-21 DIAGNOSIS — Z1231 Encounter for screening mammogram for malignant neoplasm of breast: Secondary | ICD-10-CM | POA: Diagnosis not present

## 2021-02-21 DIAGNOSIS — Z01419 Encounter for gynecological examination (general) (routine) without abnormal findings: Secondary | ICD-10-CM | POA: Diagnosis not present

## 2021-02-21 DIAGNOSIS — Z681 Body mass index (BMI) 19 or less, adult: Secondary | ICD-10-CM | POA: Diagnosis not present

## 2021-02-28 ENCOUNTER — Other Ambulatory Visit: Payer: Self-pay | Admitting: Obstetrics and Gynecology

## 2021-02-28 DIAGNOSIS — R928 Other abnormal and inconclusive findings on diagnostic imaging of breast: Secondary | ICD-10-CM

## 2021-03-29 ENCOUNTER — Ambulatory Visit
Admission: RE | Admit: 2021-03-29 | Discharge: 2021-03-29 | Disposition: A | Discharge status: Home / Self Care | Payer: 59 | Source: Ambulatory Visit | Attending: Obstetrics and Gynecology | Admitting: Obstetrics and Gynecology

## 2021-03-29 ENCOUNTER — Ambulatory Visit: Payer: 59

## 2021-03-29 DIAGNOSIS — R928 Other abnormal and inconclusive findings on diagnostic imaging of breast: Secondary | ICD-10-CM

## 2021-03-29 DIAGNOSIS — R922 Inconclusive mammogram: Secondary | ICD-10-CM | POA: Diagnosis not present

## 2021-04-18 DIAGNOSIS — L814 Other melanin hyperpigmentation: Secondary | ICD-10-CM | POA: Diagnosis not present

## 2021-04-18 DIAGNOSIS — Z23 Encounter for immunization: Secondary | ICD-10-CM | POA: Diagnosis not present

## 2021-04-18 DIAGNOSIS — L570 Actinic keratosis: Secondary | ICD-10-CM | POA: Diagnosis not present

## 2021-04-18 DIAGNOSIS — L821 Other seborrheic keratosis: Secondary | ICD-10-CM | POA: Diagnosis not present

## 2021-04-18 DIAGNOSIS — L578 Other skin changes due to chronic exposure to nonionizing radiation: Secondary | ICD-10-CM | POA: Diagnosis not present

## 2021-04-18 DIAGNOSIS — D485 Neoplasm of uncertain behavior of skin: Secondary | ICD-10-CM | POA: Diagnosis not present

## 2021-04-18 DIAGNOSIS — D225 Melanocytic nevi of trunk: Secondary | ICD-10-CM | POA: Diagnosis not present

## 2021-04-18 DIAGNOSIS — Z85828 Personal history of other malignant neoplasm of skin: Secondary | ICD-10-CM | POA: Diagnosis not present

## 2021-05-10 DIAGNOSIS — Z0000 Encounter for general adult medical examination without abnormal findings: Secondary | ICD-10-CM | POA: Diagnosis not present

## 2021-05-10 DIAGNOSIS — R7989 Other specified abnormal findings of blood chemistry: Secondary | ICD-10-CM | POA: Diagnosis not present

## 2021-05-10 DIAGNOSIS — M859 Disorder of bone density and structure, unspecified: Secondary | ICD-10-CM | POA: Diagnosis not present

## 2021-05-17 DIAGNOSIS — M8580 Other specified disorders of bone density and structure, unspecified site: Secondary | ICD-10-CM | POA: Diagnosis not present

## 2021-05-17 DIAGNOSIS — Z0000 Encounter for general adult medical examination without abnormal findings: Secondary | ICD-10-CM | POA: Diagnosis not present

## 2021-05-17 DIAGNOSIS — Z1212 Encounter for screening for malignant neoplasm of rectum: Secondary | ICD-10-CM | POA: Diagnosis not present

## 2021-05-17 DIAGNOSIS — R82998 Other abnormal findings in urine: Secondary | ICD-10-CM | POA: Diagnosis not present

## 2021-06-28 ENCOUNTER — Other Ambulatory Visit (HOSPITAL_BASED_OUTPATIENT_CLINIC_OR_DEPARTMENT_OTHER): Payer: Self-pay

## 2021-06-28 DIAGNOSIS — M7501 Adhesive capsulitis of right shoulder: Secondary | ICD-10-CM | POA: Diagnosis not present

## 2021-06-28 MED ORDER — MELOXICAM 15 MG PO TABS
ORAL_TABLET | ORAL | 3 refills | Status: DC
  Filled 2021-06-28: qty 30, 30d supply, fill #0
  Filled 2021-08-15: qty 30, 30d supply, fill #1

## 2021-07-06 DIAGNOSIS — M7501 Adhesive capsulitis of right shoulder: Secondary | ICD-10-CM | POA: Diagnosis not present

## 2021-07-06 DIAGNOSIS — M25511 Pain in right shoulder: Secondary | ICD-10-CM | POA: Diagnosis not present

## 2021-07-11 DIAGNOSIS — M7501 Adhesive capsulitis of right shoulder: Secondary | ICD-10-CM | POA: Diagnosis not present

## 2021-07-11 DIAGNOSIS — M25511 Pain in right shoulder: Secondary | ICD-10-CM | POA: Diagnosis not present

## 2021-07-14 DIAGNOSIS — M25511 Pain in right shoulder: Secondary | ICD-10-CM | POA: Diagnosis not present

## 2021-07-14 DIAGNOSIS — M7501 Adhesive capsulitis of right shoulder: Secondary | ICD-10-CM | POA: Diagnosis not present

## 2021-07-19 DIAGNOSIS — M7501 Adhesive capsulitis of right shoulder: Secondary | ICD-10-CM | POA: Diagnosis not present

## 2021-07-19 DIAGNOSIS — M25511 Pain in right shoulder: Secondary | ICD-10-CM | POA: Diagnosis not present

## 2021-07-21 DIAGNOSIS — M7501 Adhesive capsulitis of right shoulder: Secondary | ICD-10-CM | POA: Diagnosis not present

## 2021-07-21 DIAGNOSIS — M25511 Pain in right shoulder: Secondary | ICD-10-CM | POA: Diagnosis not present

## 2021-07-25 DIAGNOSIS — M7501 Adhesive capsulitis of right shoulder: Secondary | ICD-10-CM | POA: Diagnosis not present

## 2021-07-25 DIAGNOSIS — M25511 Pain in right shoulder: Secondary | ICD-10-CM | POA: Diagnosis not present

## 2021-07-25 DIAGNOSIS — H5213 Myopia, bilateral: Secondary | ICD-10-CM | POA: Diagnosis not present

## 2021-07-25 DIAGNOSIS — H524 Presbyopia: Secondary | ICD-10-CM | POA: Diagnosis not present

## 2021-07-25 DIAGNOSIS — H2513 Age-related nuclear cataract, bilateral: Secondary | ICD-10-CM | POA: Diagnosis not present

## 2021-07-28 DIAGNOSIS — M25511 Pain in right shoulder: Secondary | ICD-10-CM | POA: Diagnosis not present

## 2021-07-28 DIAGNOSIS — M7501 Adhesive capsulitis of right shoulder: Secondary | ICD-10-CM | POA: Diagnosis not present

## 2021-08-01 DIAGNOSIS — M25511 Pain in right shoulder: Secondary | ICD-10-CM | POA: Diagnosis not present

## 2021-08-01 DIAGNOSIS — M7501 Adhesive capsulitis of right shoulder: Secondary | ICD-10-CM | POA: Diagnosis not present

## 2021-08-08 DIAGNOSIS — M25511 Pain in right shoulder: Secondary | ICD-10-CM | POA: Diagnosis not present

## 2021-08-08 DIAGNOSIS — M7501 Adhesive capsulitis of right shoulder: Secondary | ICD-10-CM | POA: Diagnosis not present

## 2021-08-09 DIAGNOSIS — M25511 Pain in right shoulder: Secondary | ICD-10-CM | POA: Diagnosis not present

## 2021-08-09 DIAGNOSIS — M7501 Adhesive capsulitis of right shoulder: Secondary | ICD-10-CM | POA: Diagnosis not present

## 2021-08-11 ENCOUNTER — Other Ambulatory Visit (HOSPITAL_BASED_OUTPATIENT_CLINIC_OR_DEPARTMENT_OTHER): Payer: Self-pay

## 2021-08-11 MED ORDER — DIAZEPAM 2 MG PO TABS
ORAL_TABLET | ORAL | 0 refills | Status: DC
  Filled 2021-08-11: qty 2, 1d supply, fill #0

## 2021-08-15 ENCOUNTER — Other Ambulatory Visit (HOSPITAL_BASED_OUTPATIENT_CLINIC_OR_DEPARTMENT_OTHER): Payer: Self-pay

## 2021-08-20 DIAGNOSIS — M25511 Pain in right shoulder: Secondary | ICD-10-CM | POA: Diagnosis not present

## 2021-09-04 DIAGNOSIS — M7501 Adhesive capsulitis of right shoulder: Secondary | ICD-10-CM | POA: Diagnosis not present

## 2021-09-07 DIAGNOSIS — M25511 Pain in right shoulder: Secondary | ICD-10-CM | POA: Diagnosis not present

## 2021-09-07 DIAGNOSIS — M7501 Adhesive capsulitis of right shoulder: Secondary | ICD-10-CM | POA: Diagnosis not present

## 2021-09-12 DIAGNOSIS — M7501 Adhesive capsulitis of right shoulder: Secondary | ICD-10-CM | POA: Diagnosis not present

## 2021-09-12 DIAGNOSIS — M25511 Pain in right shoulder: Secondary | ICD-10-CM | POA: Diagnosis not present

## 2021-09-14 DIAGNOSIS — M25511 Pain in right shoulder: Secondary | ICD-10-CM | POA: Diagnosis not present

## 2021-09-14 DIAGNOSIS — M7501 Adhesive capsulitis of right shoulder: Secondary | ICD-10-CM | POA: Diagnosis not present

## 2021-09-19 DIAGNOSIS — M25511 Pain in right shoulder: Secondary | ICD-10-CM | POA: Diagnosis not present

## 2021-09-19 DIAGNOSIS — M7501 Adhesive capsulitis of right shoulder: Secondary | ICD-10-CM | POA: Diagnosis not present

## 2021-09-21 DIAGNOSIS — M7501 Adhesive capsulitis of right shoulder: Secondary | ICD-10-CM | POA: Diagnosis not present

## 2021-09-21 DIAGNOSIS — M25511 Pain in right shoulder: Secondary | ICD-10-CM | POA: Diagnosis not present

## 2021-09-26 DIAGNOSIS — M7501 Adhesive capsulitis of right shoulder: Secondary | ICD-10-CM | POA: Diagnosis not present

## 2021-09-26 DIAGNOSIS — M25511 Pain in right shoulder: Secondary | ICD-10-CM | POA: Diagnosis not present

## 2021-09-28 DIAGNOSIS — M7501 Adhesive capsulitis of right shoulder: Secondary | ICD-10-CM | POA: Diagnosis not present

## 2021-09-28 DIAGNOSIS — M25511 Pain in right shoulder: Secondary | ICD-10-CM | POA: Diagnosis not present

## 2021-10-05 DIAGNOSIS — M7501 Adhesive capsulitis of right shoulder: Secondary | ICD-10-CM | POA: Diagnosis not present

## 2021-10-06 DIAGNOSIS — M25511 Pain in right shoulder: Secondary | ICD-10-CM | POA: Diagnosis not present

## 2021-10-06 DIAGNOSIS — M7501 Adhesive capsulitis of right shoulder: Secondary | ICD-10-CM | POA: Diagnosis not present

## 2021-10-10 DIAGNOSIS — M25511 Pain in right shoulder: Secondary | ICD-10-CM | POA: Diagnosis not present

## 2021-10-10 DIAGNOSIS — M7501 Adhesive capsulitis of right shoulder: Secondary | ICD-10-CM | POA: Diagnosis not present

## 2021-10-12 DIAGNOSIS — M25511 Pain in right shoulder: Secondary | ICD-10-CM | POA: Diagnosis not present

## 2021-10-12 DIAGNOSIS — M7501 Adhesive capsulitis of right shoulder: Secondary | ICD-10-CM | POA: Diagnosis not present

## 2021-10-19 DIAGNOSIS — M7501 Adhesive capsulitis of right shoulder: Secondary | ICD-10-CM | POA: Diagnosis not present

## 2021-10-19 DIAGNOSIS — M25511 Pain in right shoulder: Secondary | ICD-10-CM | POA: Diagnosis not present

## 2021-11-16 DIAGNOSIS — M25511 Pain in right shoulder: Secondary | ICD-10-CM | POA: Diagnosis not present

## 2021-11-16 DIAGNOSIS — M7501 Adhesive capsulitis of right shoulder: Secondary | ICD-10-CM | POA: Diagnosis not present

## 2021-11-17 DIAGNOSIS — M25511 Pain in right shoulder: Secondary | ICD-10-CM | POA: Diagnosis not present

## 2021-11-17 DIAGNOSIS — M7501 Adhesive capsulitis of right shoulder: Secondary | ICD-10-CM | POA: Diagnosis not present

## 2021-12-06 DIAGNOSIS — M7501 Adhesive capsulitis of right shoulder: Secondary | ICD-10-CM | POA: Diagnosis not present

## 2021-12-06 DIAGNOSIS — Z01818 Encounter for other preprocedural examination: Secondary | ICD-10-CM | POA: Diagnosis not present

## 2021-12-06 DIAGNOSIS — J309 Allergic rhinitis, unspecified: Secondary | ICD-10-CM | POA: Diagnosis not present

## 2021-12-18 ENCOUNTER — Encounter: Payer: Self-pay | Admitting: Gastroenterology

## 2021-12-31 DIAGNOSIS — Z9889 Other specified postprocedural states: Secondary | ICD-10-CM

## 2021-12-31 HISTORY — DX: Other specified postprocedural states: Z98.890

## 2022-01-09 ENCOUNTER — Other Ambulatory Visit (HOSPITAL_BASED_OUTPATIENT_CLINIC_OR_DEPARTMENT_OTHER): Payer: Self-pay

## 2022-01-09 DIAGNOSIS — G8918 Other acute postprocedural pain: Secondary | ICD-10-CM | POA: Diagnosis not present

## 2022-01-09 DIAGNOSIS — M7501 Adhesive capsulitis of right shoulder: Secondary | ICD-10-CM | POA: Diagnosis not present

## 2022-01-09 MED ORDER — OXYCODONE HCL 5 MG PO TABS
ORAL_TABLET | ORAL | 0 refills | Status: DC
  Filled 2022-01-09: qty 40, 6d supply, fill #0

## 2022-01-09 MED ORDER — CEPHALEXIN 500 MG PO CAPS
500.0000 mg | ORAL_CAPSULE | Freq: Four times a day (QID) | ORAL | 0 refills | Status: DC
  Filled 2022-01-09: qty 12, 3d supply, fill #0

## 2022-01-09 MED ORDER — METHOCARBAMOL 500 MG PO TABS
ORAL_TABLET | ORAL | 0 refills | Status: DC
  Filled 2022-01-09: qty 40, 5d supply, fill #0

## 2022-01-09 MED ORDER — PROMETHAZINE HCL 25 MG PO TABS
ORAL_TABLET | ORAL | 0 refills | Status: DC
  Filled 2022-01-09: qty 40, 6d supply, fill #0

## 2022-01-10 DIAGNOSIS — M25611 Stiffness of right shoulder, not elsewhere classified: Secondary | ICD-10-CM | POA: Diagnosis not present

## 2022-01-10 DIAGNOSIS — M25511 Pain in right shoulder: Secondary | ICD-10-CM | POA: Diagnosis not present

## 2022-01-11 DIAGNOSIS — M25611 Stiffness of right shoulder, not elsewhere classified: Secondary | ICD-10-CM | POA: Diagnosis not present

## 2022-01-11 DIAGNOSIS — M25511 Pain in right shoulder: Secondary | ICD-10-CM | POA: Diagnosis not present

## 2022-01-12 DIAGNOSIS — M25611 Stiffness of right shoulder, not elsewhere classified: Secondary | ICD-10-CM | POA: Diagnosis not present

## 2022-01-12 DIAGNOSIS — M25511 Pain in right shoulder: Secondary | ICD-10-CM | POA: Diagnosis not present

## 2022-01-15 DIAGNOSIS — M25611 Stiffness of right shoulder, not elsewhere classified: Secondary | ICD-10-CM | POA: Diagnosis not present

## 2022-01-15 DIAGNOSIS — M25511 Pain in right shoulder: Secondary | ICD-10-CM | POA: Diagnosis not present

## 2022-03-16 ENCOUNTER — Encounter: Payer: Self-pay | Admitting: Gastroenterology

## 2022-04-04 DIAGNOSIS — M25511 Pain in right shoulder: Secondary | ICD-10-CM | POA: Diagnosis not present

## 2022-04-04 DIAGNOSIS — M25611 Stiffness of right shoulder, not elsewhere classified: Secondary | ICD-10-CM | POA: Diagnosis not present

## 2022-04-06 DIAGNOSIS — M25611 Stiffness of right shoulder, not elsewhere classified: Secondary | ICD-10-CM | POA: Diagnosis not present

## 2022-04-06 DIAGNOSIS — M25511 Pain in right shoulder: Secondary | ICD-10-CM | POA: Diagnosis not present

## 2022-04-11 DIAGNOSIS — M25511 Pain in right shoulder: Secondary | ICD-10-CM | POA: Diagnosis not present

## 2022-04-11 DIAGNOSIS — M25611 Stiffness of right shoulder, not elsewhere classified: Secondary | ICD-10-CM | POA: Diagnosis not present

## 2022-04-13 DIAGNOSIS — M25611 Stiffness of right shoulder, not elsewhere classified: Secondary | ICD-10-CM | POA: Diagnosis not present

## 2022-04-13 DIAGNOSIS — M25511 Pain in right shoulder: Secondary | ICD-10-CM | POA: Diagnosis not present

## 2022-04-16 DIAGNOSIS — M25511 Pain in right shoulder: Secondary | ICD-10-CM | POA: Diagnosis not present

## 2022-04-16 DIAGNOSIS — M25611 Stiffness of right shoulder, not elsewhere classified: Secondary | ICD-10-CM | POA: Diagnosis not present

## 2022-04-23 DIAGNOSIS — Z13820 Encounter for screening for osteoporosis: Secondary | ICD-10-CM | POA: Diagnosis not present

## 2022-04-23 DIAGNOSIS — Z681 Body mass index (BMI) 19 or less, adult: Secondary | ICD-10-CM | POA: Diagnosis not present

## 2022-04-23 DIAGNOSIS — Z01419 Encounter for gynecological examination (general) (routine) without abnormal findings: Secondary | ICD-10-CM | POA: Diagnosis not present

## 2022-04-23 DIAGNOSIS — Z124 Encounter for screening for malignant neoplasm of cervix: Secondary | ICD-10-CM | POA: Diagnosis not present

## 2022-04-23 DIAGNOSIS — Z1231 Encounter for screening mammogram for malignant neoplasm of breast: Secondary | ICD-10-CM | POA: Diagnosis not present

## 2022-04-24 DIAGNOSIS — M25611 Stiffness of right shoulder, not elsewhere classified: Secondary | ICD-10-CM | POA: Diagnosis not present

## 2022-04-24 DIAGNOSIS — M25511 Pain in right shoulder: Secondary | ICD-10-CM | POA: Diagnosis not present

## 2022-04-27 DIAGNOSIS — M25511 Pain in right shoulder: Secondary | ICD-10-CM | POA: Diagnosis not present

## 2022-04-27 DIAGNOSIS — M25611 Stiffness of right shoulder, not elsewhere classified: Secondary | ICD-10-CM | POA: Diagnosis not present

## 2022-04-30 DIAGNOSIS — M25611 Stiffness of right shoulder, not elsewhere classified: Secondary | ICD-10-CM | POA: Diagnosis not present

## 2022-04-30 DIAGNOSIS — M25511 Pain in right shoulder: Secondary | ICD-10-CM | POA: Diagnosis not present

## 2022-05-01 ENCOUNTER — Other Ambulatory Visit (HOSPITAL_BASED_OUTPATIENT_CLINIC_OR_DEPARTMENT_OTHER): Payer: Self-pay

## 2022-05-01 ENCOUNTER — Ambulatory Visit (AMBULATORY_SURGERY_CENTER): Payer: Commercial Managed Care - PPO | Admitting: *Deleted

## 2022-05-01 VITALS — Ht 70.0 in | Wt 135.0 lb

## 2022-05-01 DIAGNOSIS — Z1211 Encounter for screening for malignant neoplasm of colon: Secondary | ICD-10-CM

## 2022-05-01 MED ORDER — NA SULFATE-K SULFATE-MG SULF 17.5-3.13-1.6 GM/177ML PO SOLN
1.0000 | Freq: Once | ORAL | 0 refills | Status: AC
  Filled 2022-05-01 – 2022-05-10 (×2): qty 354, 1d supply, fill #0

## 2022-05-01 NOTE — Progress Notes (Signed)
No egg or soy allergy known to patient  No issues known to pt with past sedation with any surgeries or procedures Patient denies ever being told they had issues or difficulty with intubation  No FH of Malignant Hyperthermia Pt is not on diet pills Pt is not on  home 02  Pt is not on blood thinners  Pt denies issues with constipation  Pt is not on dialysis Pt denies any upcoming cardiac testing Pt encouraged to use to use Singlecare or Goodrx to reduce cost Patient's chart reviewed by Osvaldo Angst CNRA prior to previsit and patient appropriate for the [place].  Previsit completed and red dot placed by patient's name on their procedure day (on provider's schedule).  . Visit by phone Instructions sent by mail. Instructions reviewed with pt and pt states understanding. Instructed to review again prior to procedure. Pt states they will.

## 2022-05-09 ENCOUNTER — Other Ambulatory Visit (HOSPITAL_BASED_OUTPATIENT_CLINIC_OR_DEPARTMENT_OTHER): Payer: Self-pay

## 2022-05-09 DIAGNOSIS — M25611 Stiffness of right shoulder, not elsewhere classified: Secondary | ICD-10-CM | POA: Diagnosis not present

## 2022-05-09 DIAGNOSIS — M25511 Pain in right shoulder: Secondary | ICD-10-CM | POA: Diagnosis not present

## 2022-05-10 ENCOUNTER — Other Ambulatory Visit (HOSPITAL_BASED_OUTPATIENT_CLINIC_OR_DEPARTMENT_OTHER): Payer: Self-pay

## 2022-05-10 ENCOUNTER — Encounter: Payer: Self-pay | Admitting: Gastroenterology

## 2022-05-11 DIAGNOSIS — M25511 Pain in right shoulder: Secondary | ICD-10-CM | POA: Diagnosis not present

## 2022-05-11 DIAGNOSIS — M25611 Stiffness of right shoulder, not elsewhere classified: Secondary | ICD-10-CM | POA: Diagnosis not present

## 2022-05-17 DIAGNOSIS — M25611 Stiffness of right shoulder, not elsewhere classified: Secondary | ICD-10-CM | POA: Diagnosis not present

## 2022-05-17 DIAGNOSIS — M25511 Pain in right shoulder: Secondary | ICD-10-CM | POA: Diagnosis not present

## 2022-05-21 DIAGNOSIS — M25611 Stiffness of right shoulder, not elsewhere classified: Secondary | ICD-10-CM | POA: Diagnosis not present

## 2022-05-21 DIAGNOSIS — M25511 Pain in right shoulder: Secondary | ICD-10-CM | POA: Diagnosis not present

## 2022-05-22 ENCOUNTER — Ambulatory Visit (AMBULATORY_SURGERY_CENTER): Payer: Commercial Managed Care - PPO | Admitting: Gastroenterology

## 2022-05-22 ENCOUNTER — Encounter: Payer: Self-pay | Admitting: Gastroenterology

## 2022-05-22 VITALS — BP 124/64 | HR 60 | Temp 97.8°F | Resp 11 | Ht 70.0 in | Wt 135.0 lb

## 2022-05-22 DIAGNOSIS — Z1211 Encounter for screening for malignant neoplasm of colon: Secondary | ICD-10-CM

## 2022-05-22 DIAGNOSIS — K635 Polyp of colon: Secondary | ICD-10-CM | POA: Diagnosis not present

## 2022-05-22 DIAGNOSIS — D123 Benign neoplasm of transverse colon: Secondary | ICD-10-CM

## 2022-05-22 MED ORDER — SODIUM CHLORIDE 0.9 % IV SOLN: 500.0000 mL | INTRAVENOUS | Status: DC

## 2022-05-22 NOTE — Op Note (Signed)
[place] Patient Name: Barbara Weaver Procedure Date: 05/22/2022 10:22 AM MRN: [id number] Endoscopist: Justice Britain , MD, [id number] Age: 62 Referring MD:  Date of Birth: 1960-05-25 Gender: Female Account #: [id number] Procedure:                Colonoscopy Indications:              Screening for colorectal malignant neoplasm Medicines:                Monitored Anesthesia Care Procedure:                Pre-Anesthesia Assessment:                           - Prior to the procedure, a History and Physical                            was performed, and patient medications and                            allergies were reviewed. The patient's tolerance of                            previous anesthesia was also reviewed. The risks                            and benefits of the procedure and the sedation                            options and risks were discussed with the patient.                            All questions were answered, and informed consent                            was obtained. Prior Anticoagulants: The patient has                            taken no anticoagulant or antiplatelet agents. ASA                            Grade Assessment: II - A patient with mild systemic                            disease. After reviewing the risks and benefits,                            the patient was deemed in satisfactory condition to                            undergo the procedure.                           After obtaining informed consent, the colonoscope  was passed under direct vision. Throughout the                            procedure, the patient's blood pressure, pulse, and                            oxygen saturations were monitored continuously. The                            Olympus CF-HQ190L SN [id number] was introduced through                            the anus and advanced to the the cecum, identified                            by  appendiceal orifice and ileocecal valve. The                            colonoscopy was performed without difficulty. The                            patient tolerated the procedure. The quality of the                            bowel preparation was adequate. The terminal ileum,                            ileocecal valve, appendiceal orifice, and rectum                            were photographed. Scope In: 10:35:17 AM Scope Out: 10:47:41 AM Scope Withdrawal Time: 0 hours 8 minutes 9 seconds  Total Procedure Duration: 0 hours 12 minutes 24 seconds  Findings:                 The digital rectal exam findings include                            hemorrhoids. Pertinent negatives include no                            palpable rectal lesions.                           A 5 mm polyp was found in the hepatic flexure. The                            polyp was sessile. The polyp was removed with a                            cold snare. Resection and retrieval were complete.                           Normal mucosa was found in the entire colon  otherwise.                           Non-bleeding non-thrombosed external and internal                            hemorrhoids were found during retroflexion, during                            perianal exam and during digital exam. The                            hemorrhoids were Grade II (internal hemorrhoids                            that prolapse but reduce spontaneously). Complications:            No immediate complications. Estimated Blood Loss:     Estimated blood loss was minimal. Impression:               - Hemorrhoids found on digital rectal exam.                           - One 5 mm polyp at the hepatic flexure, removed                            with a cold snare. Resected and retrieved.                           - Normal mucosa in the entire examined colon                            otherwise.                           -  Non-bleeding non-thrombosed external and internal                            hemorrhoids. Recommendation:           - The patient will be observed post-procedure,                            until all discharge criteria are met.                           - Discharge patient to home.                           - Patient has a contact number available for                            emergencies. The signs and symptoms of potential                            delayed complications were discussed with the  patient. Return to normal activities tomorrow.                            Written discharge instructions were provided to the                            patient.                           - High fiber diet.                           - Use FiberCon 1-2 tablets PO daily.                           - Continue present medications.                           - Await pathology results.                           - Repeat colonoscopy in 5/7 years for surveillance                            based on pathology results.                           - The findings and recommendations were discussed                            with the patient.                           - The findings and recommendations were discussed                            with the patient's family. Justice Britain, MD 05/22/2022 10:53:06 AM

## 2022-05-22 NOTE — Progress Notes (Signed)
Patient reports no changes in health or medications since pre visit.

## 2022-05-22 NOTE — Progress Notes (Signed)
GASTROENTEROLOGY PROCEDURE H&P NOTE   Primary Care Physician: Prince Solian, MD  HPI: Barbara Weaver is a 62 y.o. female who presents for Colon Cancer screening colonoscopy.  Past Medical History:  Diagnosis Date   History of shoulder surgery 12/2021   R frozen shoulder   Osteoporosis    PONV (postoperative nausea and vomiting)    Past Surgical History:  Procedure Laterality Date   CESAREAN SECTION  1992   EXCISION METACARPAL MASS Right 07/31/2016   Procedure: EXCISION MASS RIGHT THUMB;  Surgeon: Daryll Brod, MD;  Location: [place];  Service: Orthopedics;  Laterality: Right;   Current Outpatient Medications  Medication Sig Dispense Refill   Elderberry 500 MG CAPS Elderberry     Multiple Vitamins-Minerals (MULTIVITAMIN PO) Take 1 tablet by mouth daily.     VITAMIN D, ERGOCALCIFEROL, PO Take 1 tablet by mouth daily.     Current Facility-Administered Medications  Medication Dose Route Frequency Provider Last Rate Last Admin   0.9 %  sodium chloride infusion  500 mL Intravenous Continuous Mansouraty, Telford Nab., MD        Current Outpatient Medications:    Elderberry 500 MG CAPS, Elderberry, Disp: , Rfl:    Multiple Vitamins-Minerals (MULTIVITAMIN PO), Take 1 tablet by mouth daily., Disp: , Rfl:    VITAMIN D, ERGOCALCIFEROL, PO, Take 1 tablet by mouth daily., Disp: , Rfl:   Current Facility-Administered Medications:    0.9 %  sodium chloride infusion, 500 mL, Intravenous, Continuous, Mansouraty, Telford Nab., MD Allergies  Allergen Reactions   Sulfa Antibiotics Rash   Family History  Problem Relation Age of Onset   Esophageal cancer Mother    Colon cancer Neg Hx    Rectal cancer Neg Hx    Stomach cancer Neg Hx    Colon polyps Neg Hx    Social History   Socioeconomic History   Marital status: Married    Spouse name: Not on file   Number of children: Not on file   Years of education: Not on file   Highest education level: Not on file   Occupational History   Not on file  Tobacco Use   Smoking status: Never   Smokeless tobacco: Never  Vaping Use   Vaping Use: Never used  Substance and Sexual Activity   Alcohol use: Yes    Alcohol/week: 5.0 standard drinks of alcohol    Types: 5 drink(s) per week   Drug use: No   Sexual activity: Not on file  Other Topics Concern   Not on file  Social History Narrative   Not on file   Social Determinants of Health   Financial Resource Strain: Not on file  Food Insecurity: Not on file  Transportation Needs: Not on file  Physical Activity: Not on file  Stress: Not on file  Social Connections: Not on file  Intimate Partner Violence: Not on file    Physical Exam: Today's Vitals   05/22/22 0948  BP: 111/69  Pulse: 70  Temp: 97.8 F (36.6 C)  SpO2: 99%  Weight: 135 lb (61.2 kg)  Height: 5' 10"$  (1.778 m)   Body mass index is 19.37 kg/m. GEN: NAD EYE: Sclerae anicteric ENT: MMM CV: Non-tachycardic GI: Soft, NT/ND NEURO:  Alert & Oriented x 3  Lab Results: No results for input(s): "WBC", "HGB", "HCT", "PLT" in the last 72 hours. BMET No results for input(s): "NA", "K", "CL", "CO2", "GLUCOSE", "BUN", "CREATININE", "CALCIUM" in the last 72 hours. LFT No results for  input(s): "PROT", "ALBUMIN", "AST", "ALT", "ALKPHOS", "BILITOT", "BILIDIR", "IBILI" in the last 72 hours. PT/INR No results for input(s): "LABPROT", "INR" in the last 72 hours.   Impression / Plan: This is a 62 y.o.female who presents for Colon Cancer screening colonoscopy.  The risks and benefits of endoscopic evaluation/treatment were discussed with the patient and/or family; these include but are not limited to the risk of perforation, infection, bleeding, missed lesions, lack of diagnosis, severe illness requiring hospitalization, as well as anesthesia and sedation related illnesses.  The patient's history has been reviewed, patient examined, no change in status, and deemed stable for procedure.   The patient and/or family is agreeable to proceed.    Justice Britain, MD [facility] Office # [id number]

## 2022-05-22 NOTE — Progress Notes (Signed)
Called to room to assist during endoscopic procedure.  Patient ID and intended procedure confirmed with present staff. Received instructions for my participation in the procedure from the performing physician.  

## 2022-05-22 NOTE — Patient Instructions (Signed)
Information on polyps and hemorrhoids given to you today.  Await pathology results.  Resume previous diet and medications.  Repeat colonoscopy in 5/7 years for surveillance based on the pathology results.  Use FiberCon tablets 1-2 each day.  YOU HAD AN ENDOSCOPIC PROCEDURE TODAY AT [facility]:   Refer to the procedure report that was given to you for any specific questions about what was found during the examination.  If the procedure report does not answer your questions, please call your gastroenterologist to clarify.  If you requested that your care partner not be given the details of your procedure findings, then the procedure report has been included in a sealed envelope for you to review at your convenience later.  YOU SHOULD EXPECT: Some feelings of bloating in the abdomen. Passage of more gas than usual.  Walking can help get rid of the air that was put into your GI tract during the procedure and reduce the bloating. If you had a lower endoscopy (such as a colonoscopy or flexible sigmoidoscopy) you may notice spotting of blood in your stool or on the toilet paper. If you underwent a bowel prep for your procedure, you may not have a normal bowel movement for a few days.  Please Note:  You might notice some irritation and congestion in your nose or some drainage.  This is from the oxygen used during your procedure.  There is no need for concern and it should clear up in a day or so.  SYMPTOMS TO REPORT IMMEDIATELY:  Following lower endoscopy (colonoscopy or flexible sigmoidoscopy):  Excessive amounts of blood in the stool  Significant tenderness or worsening of abdominal pains  Swelling of the abdomen that is new, acute  Fever of 100F or higher   For urgent or emergent issues, a gastroenterologist can be reached at any hour by calling [phone]. Do not use MyChart messaging for urgent concerns.    DIET:  We do recommend a small meal at first, but then you  may proceed to your regular diet.  Drink plenty of fluids but you should avoid alcoholic beverages for 24 hours.  ACTIVITY:  You should plan to take it easy for the rest of today and you should NOT DRIVE or use heavy machinery until tomorrow (because of the sedation medicines used during the test).    FOLLOW UP: Our staff will call the number listed on your records the next business day following your procedure.  We will call around 7:15- 8:00 am to check on you and address any questions or concerns that you may have regarding the information given to you following your procedure. If we do not reach you, we will leave a message.     If any biopsies were taken you will be contacted by phone or by letter within the next 1-3 weeks.  Please call us at [phone] if you have not heard about the biopsies in 3 weeks.    SIGNATURES/CONFIDENTIALITY: You and/or your care partner have signed paperwork which will be entered into your electronic medical record.  These signatures attest to the fact that that the information above on your After Visit Summary has been reviewed and is understood.  Full responsibility of the confidentiality of this discharge information lies with you and/or your care-partner.

## 2022-05-22 NOTE — Progress Notes (Signed)
To pacu, VSS. Report to [email or web address] 

## 2022-05-23 ENCOUNTER — Telehealth: Payer: Self-pay

## 2022-05-23 NOTE — Telephone Encounter (Signed)
Attempted follow up call; no answer; left VM.

## 2022-05-25 DIAGNOSIS — M25511 Pain in right shoulder: Secondary | ICD-10-CM | POA: Diagnosis not present

## 2022-05-25 DIAGNOSIS — M25611 Stiffness of right shoulder, not elsewhere classified: Secondary | ICD-10-CM | POA: Diagnosis not present

## 2022-05-28 ENCOUNTER — Encounter: Payer: Self-pay | Admitting: Gastroenterology

## 2022-05-28 DIAGNOSIS — M25611 Stiffness of right shoulder, not elsewhere classified: Secondary | ICD-10-CM | POA: Diagnosis not present

## 2022-05-28 DIAGNOSIS — M25511 Pain in right shoulder: Secondary | ICD-10-CM | POA: Diagnosis not present

## 2022-05-30 DIAGNOSIS — M25611 Stiffness of right shoulder, not elsewhere classified: Secondary | ICD-10-CM | POA: Diagnosis not present

## 2022-05-30 DIAGNOSIS — M25511 Pain in right shoulder: Secondary | ICD-10-CM | POA: Diagnosis not present

## 2022-06-01 DIAGNOSIS — Z4789 Encounter for other orthopedic aftercare: Secondary | ICD-10-CM | POA: Diagnosis not present

## 2022-06-04 DIAGNOSIS — M25611 Stiffness of right shoulder, not elsewhere classified: Secondary | ICD-10-CM | POA: Diagnosis not present

## 2022-06-04 DIAGNOSIS — M25511 Pain in right shoulder: Secondary | ICD-10-CM | POA: Diagnosis not present

## 2022-06-07 DIAGNOSIS — M25511 Pain in right shoulder: Secondary | ICD-10-CM | POA: Diagnosis not present

## 2022-06-07 DIAGNOSIS — M25611 Stiffness of right shoulder, not elsewhere classified: Secondary | ICD-10-CM | POA: Diagnosis not present

## 2022-06-11 DIAGNOSIS — Z0000 Encounter for general adult medical examination without abnormal findings: Secondary | ICD-10-CM | POA: Diagnosis not present

## 2022-06-11 DIAGNOSIS — E559 Vitamin D deficiency, unspecified: Secondary | ICD-10-CM | POA: Diagnosis not present

## 2022-06-11 DIAGNOSIS — R7989 Other specified abnormal findings of blood chemistry: Secondary | ICD-10-CM | POA: Diagnosis not present

## 2022-06-13 DIAGNOSIS — M25611 Stiffness of right shoulder, not elsewhere classified: Secondary | ICD-10-CM | POA: Diagnosis not present

## 2022-06-13 DIAGNOSIS — M25511 Pain in right shoulder: Secondary | ICD-10-CM | POA: Diagnosis not present

## 2022-06-18 DIAGNOSIS — Z0000 Encounter for general adult medical examination without abnormal findings: Secondary | ICD-10-CM | POA: Diagnosis not present

## 2022-06-18 DIAGNOSIS — D72819 Decreased white blood cell count, unspecified: Secondary | ICD-10-CM | POA: Diagnosis not present

## 2022-06-18 DIAGNOSIS — M8580 Other specified disorders of bone density and structure, unspecified site: Secondary | ICD-10-CM | POA: Diagnosis not present

## 2022-06-18 DIAGNOSIS — M7501 Adhesive capsulitis of right shoulder: Secondary | ICD-10-CM | POA: Diagnosis not present

## 2022-06-21 DIAGNOSIS — M25511 Pain in right shoulder: Secondary | ICD-10-CM | POA: Diagnosis not present

## 2022-06-21 DIAGNOSIS — M25611 Stiffness of right shoulder, not elsewhere classified: Secondary | ICD-10-CM | POA: Diagnosis not present

## 2022-06-28 DIAGNOSIS — M25511 Pain in right shoulder: Secondary | ICD-10-CM | POA: Diagnosis not present

## 2022-06-28 DIAGNOSIS — M25611 Stiffness of right shoulder, not elsewhere classified: Secondary | ICD-10-CM | POA: Diagnosis not present

## 2022-07-05 DIAGNOSIS — M25611 Stiffness of right shoulder, not elsewhere classified: Secondary | ICD-10-CM | POA: Diagnosis not present

## 2022-07-05 DIAGNOSIS — M25511 Pain in right shoulder: Secondary | ICD-10-CM | POA: Diagnosis not present

## 2022-07-13 DIAGNOSIS — M25511 Pain in right shoulder: Secondary | ICD-10-CM | POA: Diagnosis not present

## 2022-07-13 DIAGNOSIS — M25611 Stiffness of right shoulder, not elsewhere classified: Secondary | ICD-10-CM | POA: Diagnosis not present

## 2022-07-19 ENCOUNTER — Other Ambulatory Visit (HOSPITAL_BASED_OUTPATIENT_CLINIC_OR_DEPARTMENT_OTHER): Payer: Self-pay

## 2022-07-19 DIAGNOSIS — D225 Melanocytic nevi of trunk: Secondary | ICD-10-CM | POA: Diagnosis not present

## 2022-07-19 DIAGNOSIS — L578 Other skin changes due to chronic exposure to nonionizing radiation: Secondary | ICD-10-CM | POA: Diagnosis not present

## 2022-07-19 DIAGNOSIS — L570 Actinic keratosis: Secondary | ICD-10-CM | POA: Diagnosis not present

## 2022-07-19 DIAGNOSIS — D485 Neoplasm of uncertain behavior of skin: Secondary | ICD-10-CM | POA: Diagnosis not present

## 2022-07-19 DIAGNOSIS — L821 Other seborrheic keratosis: Secondary | ICD-10-CM | POA: Diagnosis not present

## 2022-07-19 DIAGNOSIS — L814 Other melanin hyperpigmentation: Secondary | ICD-10-CM | POA: Diagnosis not present

## 2022-07-19 DIAGNOSIS — Z85828 Personal history of other malignant neoplasm of skin: Secondary | ICD-10-CM | POA: Diagnosis not present

## 2022-07-19 DIAGNOSIS — T148XXA Other injury of unspecified body region, initial encounter: Secondary | ICD-10-CM | POA: Diagnosis not present

## 2022-07-19 MED ORDER — MUPIROCIN 2 % EX OINT
TOPICAL_OINTMENT | CUTANEOUS | 0 refills | Status: AC
  Filled 2022-07-19: qty 22, 5d supply, fill #0

## 2022-08-13 DIAGNOSIS — H5213 Myopia, bilateral: Secondary | ICD-10-CM | POA: Diagnosis not present

## 2022-09-27 ENCOUNTER — Other Ambulatory Visit (HOSPITAL_BASED_OUTPATIENT_CLINIC_OR_DEPARTMENT_OTHER): Payer: Self-pay

## 2022-09-27 MED ORDER — PAXLOVID (300/100) 20 X 150 MG & 10 X 100MG PO TBPK
3.0000 | ORAL_TABLET | Freq: Two times a day (BID) | ORAL | 0 refills | Status: AC
  Filled 2022-09-27: qty 30, 5d supply, fill #0

## 2022-10-02 NOTE — Telephone Encounter (Signed)
Follow up post procedure; no answer.

## 2023-01-24 ENCOUNTER — Other Ambulatory Visit (HOSPITAL_BASED_OUTPATIENT_CLINIC_OR_DEPARTMENT_OTHER): Payer: Self-pay

## 2023-01-24 MED ORDER — COMIRNATY 30 MCG/0.3ML IM SUSY
0.3000 mL | PREFILLED_SYRINGE | Freq: Once | INTRAMUSCULAR | 0 refills | Status: AC
  Filled 2023-01-24: qty 0.3, 1d supply, fill #0

## 2023-01-24 MED ORDER — FLULAVAL 0.5 ML IM SUSY
0.5000 mL | PREFILLED_SYRINGE | Freq: Once | INTRAMUSCULAR | 0 refills | Status: AC
  Filled 2023-01-24: qty 0.5, 1d supply, fill #0

## 2023-05-16 DIAGNOSIS — Z681 Body mass index (BMI) 19 or less, adult: Secondary | ICD-10-CM | POA: Diagnosis not present

## 2023-05-16 DIAGNOSIS — Z1231 Encounter for screening mammogram for malignant neoplasm of breast: Secondary | ICD-10-CM | POA: Diagnosis not present

## 2023-05-16 DIAGNOSIS — Z01419 Encounter for gynecological examination (general) (routine) without abnormal findings: Secondary | ICD-10-CM | POA: Diagnosis not present

## 2023-05-30 DIAGNOSIS — J069 Acute upper respiratory infection, unspecified: Secondary | ICD-10-CM | POA: Diagnosis not present

## 2023-05-30 DIAGNOSIS — G43909 Migraine, unspecified, not intractable, without status migrainosus: Secondary | ICD-10-CM | POA: Diagnosis not present

## 2023-05-30 DIAGNOSIS — R051 Acute cough: Secondary | ICD-10-CM | POA: Diagnosis not present

## 2023-05-30 DIAGNOSIS — R0981 Nasal congestion: Secondary | ICD-10-CM | POA: Diagnosis not present

## 2023-05-30 DIAGNOSIS — Z1152 Encounter for screening for COVID-19: Secondary | ICD-10-CM | POA: Diagnosis not present

## 2023-05-30 DIAGNOSIS — J029 Acute pharyngitis, unspecified: Secondary | ICD-10-CM | POA: Diagnosis not present

## 2023-05-30 IMAGING — MG MM DIGITAL DIAGNOSTIC UNILAT*L* W/ TOMO W/ CAD
4 series · 4 of 12 positions shown · non-contrast
Comparison: Prior mammograms dating back to 8559

CLINICAL DATA: 60-year-old female for further evaluation of
possible LEFT breast asymmetry on screening mammogram.

EXAM:
DIGITAL DIAGNOSTIC UNILATERAL LEFT MAMMOGRAM WITH TOMOSYNTHESIS AND
CAD
TECHNIQUE: Left digital diagnostic mammography and breast tomosynthesis was
performed. The images were evaluated with computer-aided detection.

[L MLO synth-2D]
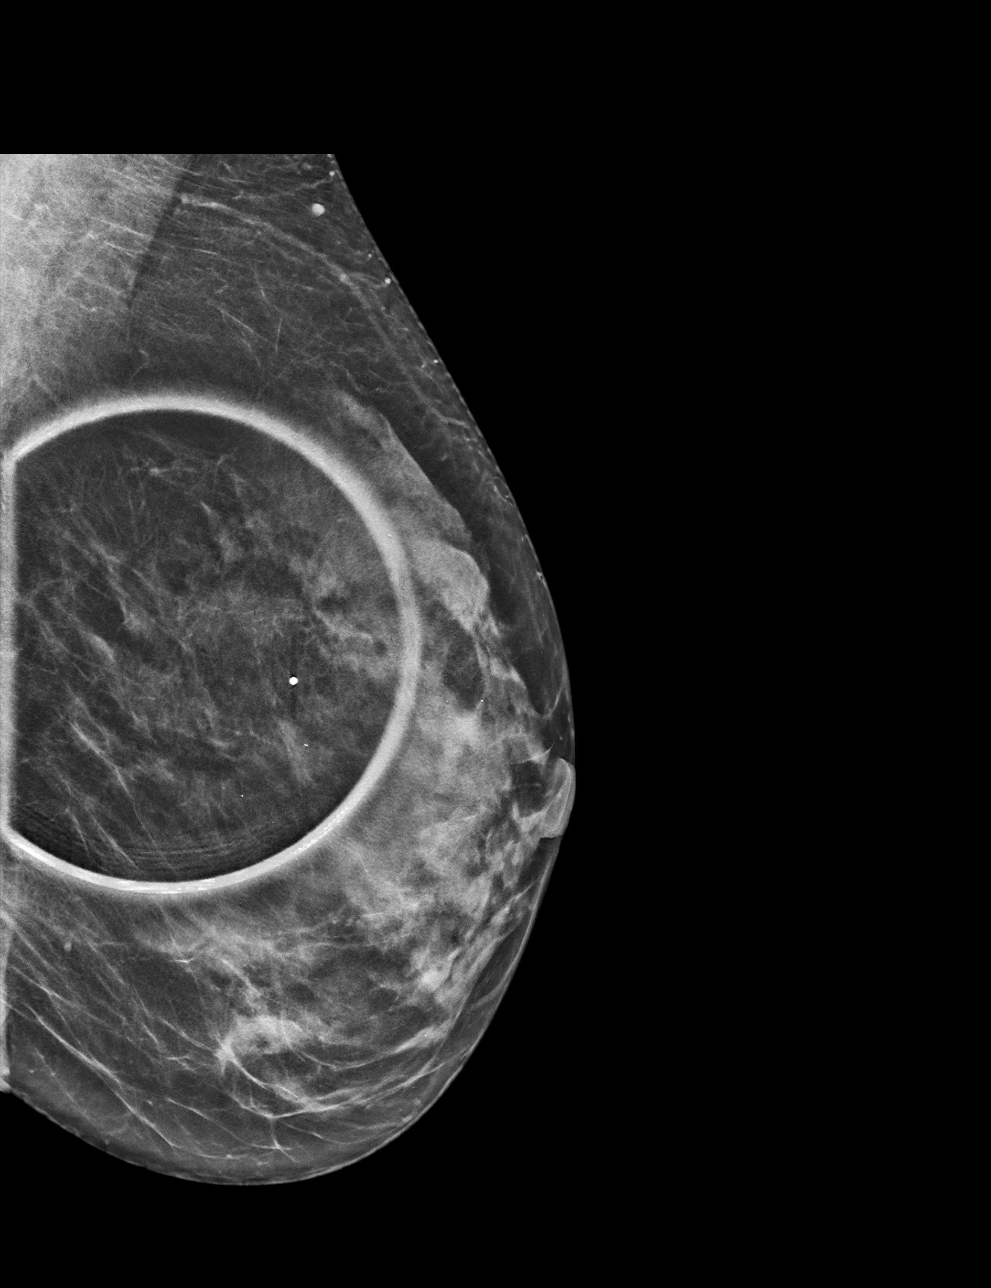

[L ML synth-2D]
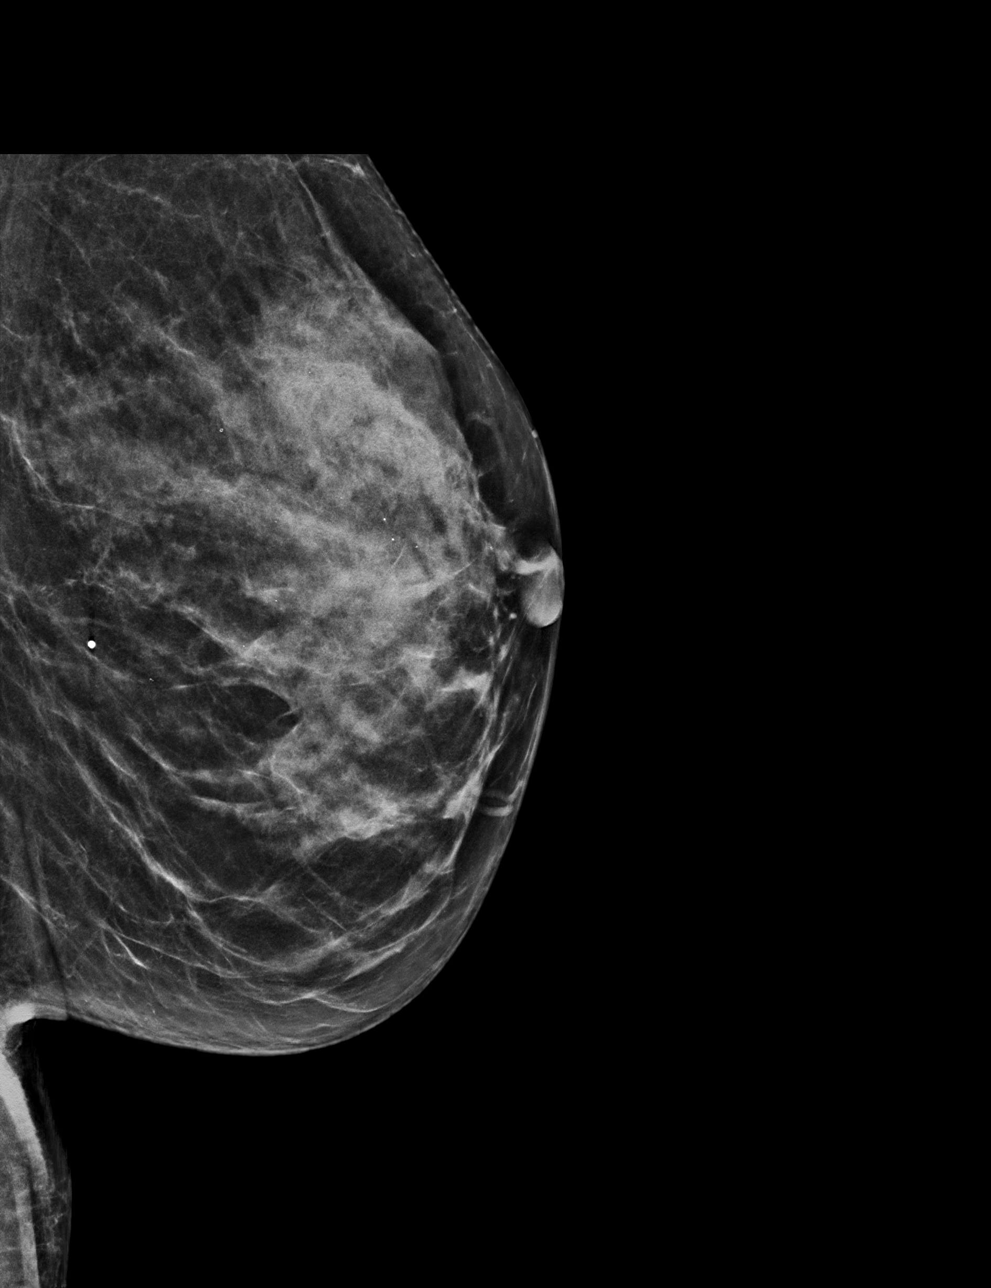

[L MLO tomo · tomo slice 29/56.0]
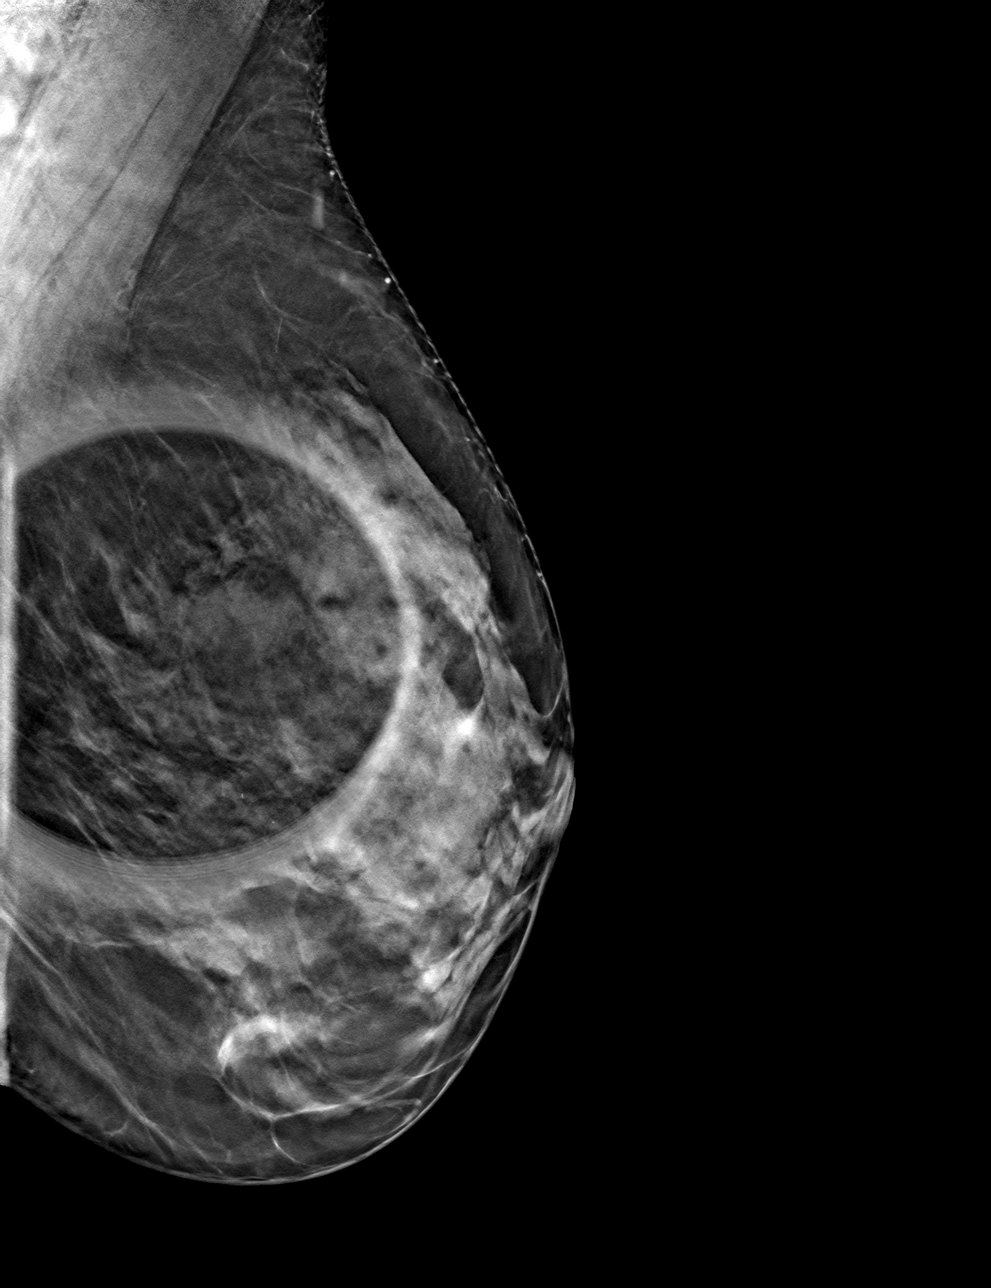

[L ML tomo · tomo slice 29/56.0]
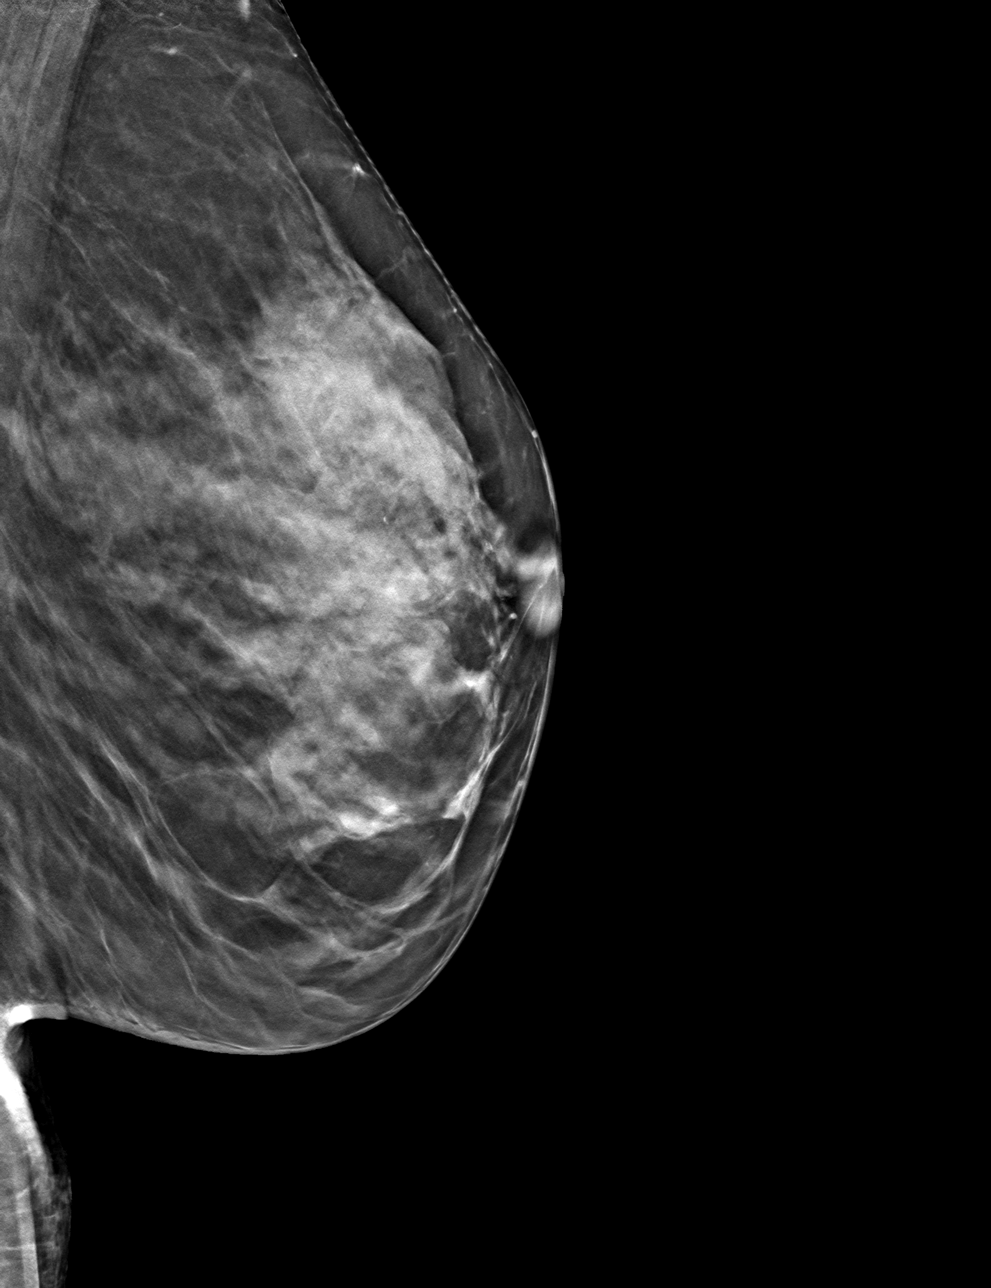

[4 of 12 positions shown; findings below may reference images not displayed]

ACR Breast Density Category d: The breast tissue is extremely dense,
which lowers the sensitivity of mammography.
FINDINGS: 2D/3D full field and spot compression views of the LEFT breast
demonstrate dispersal of the screening study asymmetry without
persistent suspicious abnormality. On today's images, this area has
a similar appearance to remote studies.
IMPRESSION: No persistent suspicious abnormality at the site of the screening
study finding.

RECOMMENDATION:
Bilateral screening mammogram in 1 year.

I have discussed the findings and recommendations with the patient.
If applicable, a reminder letter will be sent to the patient
regarding the next appointment.

BI-RADS CATEGORY  1: Negative.

## 2023-06-21 DIAGNOSIS — M8580 Other specified disorders of bone density and structure, unspecified site: Secondary | ICD-10-CM | POA: Diagnosis not present

## 2023-06-21 DIAGNOSIS — R7989 Other specified abnormal findings of blood chemistry: Secondary | ICD-10-CM | POA: Diagnosis not present

## 2023-06-24 DIAGNOSIS — Z85828 Personal history of other malignant neoplasm of skin: Secondary | ICD-10-CM | POA: Diagnosis not present

## 2023-06-24 DIAGNOSIS — L814 Other melanin hyperpigmentation: Secondary | ICD-10-CM | POA: Diagnosis not present

## 2023-06-24 DIAGNOSIS — D225 Melanocytic nevi of trunk: Secondary | ICD-10-CM | POA: Diagnosis not present

## 2023-06-24 DIAGNOSIS — L578 Other skin changes due to chronic exposure to nonionizing radiation: Secondary | ICD-10-CM | POA: Diagnosis not present

## 2023-06-24 DIAGNOSIS — L821 Other seborrheic keratosis: Secondary | ICD-10-CM | POA: Diagnosis not present

## 2023-06-28 DIAGNOSIS — M8580 Other specified disorders of bone density and structure, unspecified site: Secondary | ICD-10-CM | POA: Diagnosis not present

## 2023-06-28 DIAGNOSIS — D126 Benign neoplasm of colon, unspecified: Secondary | ICD-10-CM | POA: Diagnosis not present

## 2023-06-28 DIAGNOSIS — Z1331 Encounter for screening for depression: Secondary | ICD-10-CM | POA: Diagnosis not present

## 2023-06-28 DIAGNOSIS — Z0000 Encounter for general adult medical examination without abnormal findings: Secondary | ICD-10-CM | POA: Diagnosis not present

## 2023-06-28 DIAGNOSIS — M79644 Pain in right finger(s): Secondary | ICD-10-CM | POA: Diagnosis not present

## 2023-06-28 DIAGNOSIS — R82998 Other abnormal findings in urine: Secondary | ICD-10-CM | POA: Diagnosis not present

## 2023-06-28 DIAGNOSIS — Z1339 Encounter for screening examination for other mental health and behavioral disorders: Secondary | ICD-10-CM | POA: Diagnosis not present

## 2023-06-28 DIAGNOSIS — M7501 Adhesive capsulitis of right shoulder: Secondary | ICD-10-CM | POA: Diagnosis not present

## 2023-10-28 DIAGNOSIS — M79671 Pain in right foot: Secondary | ICD-10-CM | POA: Diagnosis not present

## 2023-11-11 DIAGNOSIS — S92354A Nondisplaced fracture of fifth metatarsal bone, right foot, initial encounter for closed fracture: Secondary | ICD-10-CM | POA: Diagnosis not present

## 2023-11-15 DIAGNOSIS — S92354A Nondisplaced fracture of fifth metatarsal bone, right foot, initial encounter for closed fracture: Secondary | ICD-10-CM | POA: Diagnosis not present

## 2023-12-24 ENCOUNTER — Other Ambulatory Visit (HOSPITAL_BASED_OUTPATIENT_CLINIC_OR_DEPARTMENT_OTHER): Payer: Self-pay

## 2023-12-24 DIAGNOSIS — S92354A Nondisplaced fracture of fifth metatarsal bone, right foot, initial encounter for closed fracture: Secondary | ICD-10-CM | POA: Diagnosis not present

## 2023-12-24 MED ORDER — MELOXICAM 7.5 MG PO TABS
7.5000 mg | ORAL_TABLET | Freq: Every day | ORAL | 0 refills | Status: AC | PRN
  Filled 2023-12-24: qty 30, 30d supply, fill #0

## 2024-01-01 ENCOUNTER — Other Ambulatory Visit (HOSPITAL_BASED_OUTPATIENT_CLINIC_OR_DEPARTMENT_OTHER): Payer: Self-pay

## 2024-01-01 MED ORDER — FLUZONE 0.5 ML IM SUSY
0.5000 mL | PREFILLED_SYRINGE | Freq: Once | INTRAMUSCULAR | 0 refills | Status: AC
  Filled 2024-01-01: qty 0.5, 1d supply, fill #0

## 2024-01-20 ENCOUNTER — Other Ambulatory Visit (HOSPITAL_COMMUNITY): Payer: Self-pay
